# Patient Record
Sex: Female | Born: 1970 | Race: Black or African American | Hispanic: No | Marital: Single | Smoking: Current every day smoker
Health system: Southern US, Community
[De-identification: ages and names within clinical notes are randomized; demographics above are authoritative.]

## PROBLEM LIST (undated history)

## (undated) DIAGNOSIS — D649 Anemia, unspecified: Secondary | ICD-10-CM

---

## 1998-09-18 ENCOUNTER — Emergency Department (HOSPITAL_COMMUNITY): Admission: EM | Admit: 1998-09-18 | Discharge: 1998-09-18 | Payer: Self-pay | Admitting: Emergency Medicine

## 1998-09-25 ENCOUNTER — Emergency Department (HOSPITAL_COMMUNITY): Admission: EM | Admit: 1998-09-25 | Discharge: 1998-09-25 | Payer: Self-pay | Admitting: Emergency Medicine

## 1999-03-16 ENCOUNTER — Emergency Department (HOSPITAL_COMMUNITY): Admission: EM | Admit: 1999-03-16 | Discharge: 1999-03-16 | Payer: Self-pay | Admitting: Emergency Medicine

## 1999-04-09 ENCOUNTER — Encounter: Payer: Self-pay | Admitting: Emergency Medicine

## 1999-04-09 ENCOUNTER — Emergency Department (HOSPITAL_COMMUNITY): Admission: EM | Admit: 1999-04-09 | Discharge: 1999-04-09 | Payer: Self-pay | Admitting: Emergency Medicine

## 1999-07-04 ENCOUNTER — Inpatient Hospital Stay (HOSPITAL_COMMUNITY): Admission: EM | Admit: 1999-07-04 | Discharge: 1999-07-06 | Payer: Self-pay | Admitting: *Deleted

## 1999-07-04 ENCOUNTER — Encounter: Payer: Self-pay | Admitting: Emergency Medicine

## 1999-07-05 ENCOUNTER — Encounter: Payer: Self-pay | Admitting: Internal Medicine

## 1999-12-04 ENCOUNTER — Emergency Department (HOSPITAL_COMMUNITY): Admission: EM | Admit: 1999-12-04 | Discharge: 1999-12-04 | Payer: Self-pay | Admitting: Emergency Medicine

## 2002-06-03 ENCOUNTER — Other Ambulatory Visit: Admission: RE | Admit: 2002-06-03 | Discharge: 2002-06-03 | Payer: Self-pay | Admitting: Family Medicine

## 2003-08-25 ENCOUNTER — Emergency Department (HOSPITAL_COMMUNITY): Admission: EM | Admit: 2003-08-25 | Discharge: 2003-08-25 | Payer: Self-pay | Admitting: Emergency Medicine

## 2006-09-27 ENCOUNTER — Emergency Department (HOSPITAL_COMMUNITY): Admission: EM | Admit: 2006-09-27 | Discharge: 2006-09-27 | Payer: Self-pay | Admitting: Emergency Medicine

## 2010-05-25 ENCOUNTER — Observation Stay (HOSPITAL_COMMUNITY): Admission: EM | Admit: 2010-05-25 | Discharge: 2010-05-26 | Payer: Self-pay | Admitting: Family Medicine

## 2010-10-20 LAB — CROSSMATCH
ABO/RH(D): O POS
Antibody Screen: NEGATIVE
Unit division: 0
Unit division: 0
Unit division: 0

## 2010-10-20 LAB — ABO/RH: ABO/RH(D): O POS

## 2010-12-24 NOTE — Consult Note (Signed)
[place]. [facility]  Patient:    Candace Brown                          MRN: [id number] Proc. Date: 07/05/99 Adm. Date:  [id number] Attending:  Farley Ly                          Consultation Report  REASON FOR CONSULTATION:  Asked to see patient at the request of Farley Ly, M.D. for evaluation and opinion because of heavy periods and  anemia.  HISTORY OF PRESENT ILLNESS:  The patient is an inpatient at [facility], ______  [address], bed 2,  and was admitted on July 04, 1999.  The patient is a 40 year old gravida 4, para 4, with a history of menometrorrhagia and irregular  cycles.  She had been on oral contraceptives successfully until eight months ago when she voluntarily stopped them because she did not want to take them any more. Approximately one month following this, she began to have heavier more irregular periods, lasting anywhere from 7 to 14 days, passing clots and having cramps. he presented to the hospital with fatigue and was found to have a hemoglobin of 5.8. She was not bleeding at the time and had no lower abdominal pain.  PAST MEDICAL HISTORY:  Four vaginal deliveries.  No history of STDs. Contraception:  Condoms.  The patient denies pain.  Denies history of fibroids r family history of significant GYN problems.  REVIEW OF SYSTEMS:  Otherwise, review of systems is negative.  ALLERGIES:  No known drug allergies.  SOCIAL HISTORY:  She smokes one-quarter pack of cigarettes daily.  Social alcohol. No recreational drugs.  No history of current hormonal use.  No history of rectal bleeding.  LABORATORY DATA:  The patients laboratory work to this point shows a hemoglobin of 5.5, GC and Chlamydia cultures negative, an ultrasound showing a uterus with a -cm area consistent with a midbody fibroid, not impinging on the endometrial cavity, the endometrial cavity was 9.3 mm in width, which was normal, and her  last menstrual period was May 22, 1999.  PHYSICAL EXAMINATION:  GENERAL:  Patient is alert, oriented, cooperative and in no acute distress.  ABDOMEN:  Exam shows soft and tender abdomen.  No masses or organomegaly, hepatosplenomegaly, inguinal lymphadenopathy or CVA tenderness.  PELVIC:   Exam shows normal external genitals, normal vagina with no bleeding or discharge, no cervical motion tenderness.  Uterus is top-normal-size, shape and  consistency, mobile and nontender.  No adnexal masses or tenderness are noted.  RECTAL:  Deferred.  IMPRESSION:  Menometrorrhagia and anemia, which is symptomatic.  Patients urine  pregnancy test was negative yesterday.  She has a small fibroid uterus, not thought to be contributing to the patients current problem.  RECOMMENDATION:  I recommend the patient undergo a withdrawal bleed using Provera 10 mg daily for seven days, followed by the initiation of a low-dose combined oral contraceptive she was previously on for a three-month trial.  Iron therapy two o three times daily after blood transfusion, which is currently planned at this time, is appropriate and followup with the G[facility] Clinic at [facility] is also appropriate. DD:  07/05/99 TD:  07/06/99 Job: [id number]

## 2017-06-21 ENCOUNTER — Emergency Department (HOSPITAL_COMMUNITY): Payer: No Typology Code available for payment source

## 2017-06-21 ENCOUNTER — Inpatient Hospital Stay (HOSPITAL_COMMUNITY)
Admission: EM | Admit: 2017-06-21 | Discharge: 2017-06-30 | DRG: 125 | Payer: No Typology Code available for payment source | Attending: Internal Medicine | Admitting: Internal Medicine

## 2017-06-21 ENCOUNTER — Encounter (HOSPITAL_COMMUNITY): Payer: Self-pay | Admitting: *Deleted

## 2017-06-21 DIAGNOSIS — D509 Iron deficiency anemia, unspecified: Secondary | ICD-10-CM | POA: Diagnosis present

## 2017-06-21 DIAGNOSIS — R760 Raised antibody titer: Secondary | ICD-10-CM | POA: Diagnosis not present

## 2017-06-21 DIAGNOSIS — N920 Excessive and frequent menstruation with regular cycle: Secondary | ICD-10-CM | POA: Diagnosis present

## 2017-06-21 DIAGNOSIS — R911 Solitary pulmonary nodule: Secondary | ICD-10-CM | POA: Diagnosis present

## 2017-06-21 DIAGNOSIS — D72829 Elevated white blood cell count, unspecified: Secondary | ICD-10-CM | POA: Diagnosis not present

## 2017-06-21 DIAGNOSIS — T380X5A Adverse effect of glucocorticoids and synthetic analogues, initial encounter: Secondary | ICD-10-CM | POA: Diagnosis not present

## 2017-06-21 DIAGNOSIS — H5461 Unqualified visual loss, right eye, normal vision left eye: Secondary | ICD-10-CM | POA: Diagnosis not present

## 2017-06-21 DIAGNOSIS — R51 Headache: Secondary | ICD-10-CM | POA: Diagnosis present

## 2017-06-21 DIAGNOSIS — G939 Disorder of brain, unspecified: Secondary | ICD-10-CM | POA: Diagnosis present

## 2017-06-21 DIAGNOSIS — R9089 Other abnormal findings on diagnostic imaging of central nervous system: Secondary | ICD-10-CM

## 2017-06-21 DIAGNOSIS — D473 Essential (hemorrhagic) thrombocythemia: Secondary | ICD-10-CM

## 2017-06-21 DIAGNOSIS — D75839 Thrombocytosis, unspecified: Secondary | ICD-10-CM

## 2017-06-21 DIAGNOSIS — F17210 Nicotine dependence, cigarettes, uncomplicated: Secondary | ICD-10-CM | POA: Diagnosis present

## 2017-06-21 DIAGNOSIS — H539 Unspecified visual disturbance: Secondary | ICD-10-CM

## 2017-06-21 DIAGNOSIS — D649 Anemia, unspecified: Secondary | ICD-10-CM

## 2017-06-21 HISTORY — DX: Anemia, unspecified: D64.9

## 2017-06-21 MED ORDER — LORAZEPAM 2 MG/ML IJ SOLN
1.0000 mg | Freq: Once | INTRAMUSCULAR | Status: AC
  Administered 2017-06-21: 1 mg via INTRAVENOUS
  Filled 2017-06-21: qty 1

## 2017-06-21 MED ORDER — MAGNESIUM SULFATE 2 GM/50ML IV SOLN: 2.0000 g | Freq: Once | INTRAVENOUS | Status: DC

## 2017-06-21 MED ORDER — GADOBENATE DIMEGLUMINE 529 MG/ML IV SOLN
20.0000 mL | Freq: Once | INTRAVENOUS | Status: AC | PRN
  Administered 2017-06-21: 17 mL via INTRAVENOUS

## 2017-06-21 NOTE — ED Provider Notes (Signed)
Patient care signed out by Joline Maxcy, PA-C, at end of shift. She presents with no medical history having moderate headache pain for the past 2 weeks. She reports Exedrin makes the pain better. For the last one week, she has noticed blurred vision in her right eye. Yesterday she lost all vision in the same eye.   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 Neurohospitalist (Dr. Rory Percy) consulted who recommended MR brain and orbits w and w/o CM. He will see her in the ED on completion of those tests.   Dr. Rory Percy in the ED to examine the patient. MRI results are significantly abnormal but nonspecific. He reports he spoke to neuro-radiology who gives results and recommends LP, and further states LP is safe for the patient to undergo. Discussed with ED attending, Dr. Randal Buba, who states this would need to be done under fluoro guided procedure by IR. Per Dr. Rory Percy, procedure should be done no later than this morning. DDx: meningitis vs mass vs metastasis. He does not recommend medications (ie antibiotics or steroids) at this time. He will leave his recommendations for tests to be run on the CSF in his consult note. Patient can be admitted by the hospitalist at [facility].     Charlann Lange, PA-C 06/22/17 [phone]

## 2017-06-21 NOTE — ED Triage Notes (Signed)
Pt complains of migraine and loss of vision in her right eye. Pt noticed blurred vision in her right eye 1 week ago. Pt states she noticed she could not see with her right eye 2 days ago. Vision in her left eye is unchanged.

## 2017-06-21 NOTE — ED Provider Notes (Signed)
[facility] DEPT Provider Note   CSN: [id number] Arrival date & time: 06/21/17  1819     History   Chief Complaint Chief Complaint  Patient presents with  . Migraine  . Visual Field Change    HPI Candace Brown is a 46 y.o. female who presents to the emergency department with a chief complaint of intermittent headaches over the last 2 weeks.  She reports associated blurred vision in her right eye that began 1 week ago that progressively worsened until she gradually lost vision in the right eye 2 days ago.  She reports a history of chronic headaches that are relieved with taking Excedrin.  She reports the current headache is located over her right forehead and behind her right eye.  She denies fever, chills, periorbital pain or swelling, nausea, vomiting, eye redness or pain.  No recent trauma or injury to the right eye.  No dysarthria, syncope, or dizziness, or numbness.  No left eye complaints or change in vision.  The patient is otherwise healthy.  No chronic medical conditions.  No daily medications.  She is a current everyday smoker.  She is not established with neurology for her headaches.   The history is provided by the patient. No language interpreter was used.    History reviewed. No pertinent past medical history.  There are no active problems to display for this patient.   History reviewed. No pertinent surgical history.  OB History    No data available       Home Medications    Prior to Admission medications   Medication Sig Start Date End Date Taking? Authorizing Provider  ferrous sulfate 325 (65 FE) MG EC tablet Take 325 mg daily with breakfast by mouth.   Yes [provider]  FIBER PO Take 1 capsule daily by mouth.   Yes [provider]  Multiple Vitamins-Minerals (MULTIVITAMIN ADULT) TABS Take 1 tablet daily by mouth.   Yes [provider]    Family History No family history on file.  Social  History Social History   Tobacco Use  . Smoking status: Current Every Day Smoker  . Smokeless tobacco: Never Used  Substance Use Topics  . Alcohol use: No    Frequency: Never  . Drug use: No     Allergies   Patient has no known allergies.   Review of Systems Review of Systems  Constitutional: Negative for activity change, chills and fever.  HENT: Negative for congestion.   Eyes: Positive for visual disturbance. Negative for pain, discharge, redness and itching.  Respiratory: Negative for cough and shortness of breath.   Cardiovascular: Negative for chest pain.  Gastrointestinal: Negative for abdominal pain, diarrhea, nausea and vomiting.  Genitourinary: Negative for dysuria.  Musculoskeletal: Negative for back pain, gait problem, joint swelling, neck pain and neck stiffness.  Skin: Negative for rash.  Allergic/Immunologic: Negative for immunocompromised state.  Neurological: Positive for headaches. Negative for syncope, speech difficulty, weakness, light-headedness and numbness.  Psychiatric/Behavioral: Negative for confusion.     Physical Exam Updated Vital Signs BP 132/90   Pulse 68   Temp 98.5 F (36.9 C) (Oral)   Resp 18   LMP 06/19/2017   SpO2 100%   Physical Exam  Constitutional: No distress.  HENT:  Head: Normocephalic.  Eyes: Conjunctivae are normal. Right eye exhibits no chemosis, no discharge, no exudate and no hordeolum. Left eye exhibits no chemosis, no discharge, no exudate and no hordeolum. Right conjunctiva is not injected. Right conjunctiva  has no hemorrhage. Left conjunctiva is not injected. Left conjunctiva has no hemorrhage. No scleral icterus. Right eye exhibits abnormal extraocular motion. Left eye exhibits normal extraocular motion. Left pupil is round and reactive.  Drooping of the right eyelid.  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  No nystagmus.  Normal extraocular movements of the left eye.  No chemosis, discharge.   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   Neck: Neck supple.  Cardiovascular: Normal rate, regular rhythm and normal heart sounds. Exam reveals no gallop and no friction rub.  No murmur heard. Pulmonary/Chest: Effort normal and breath sounds normal. No stridor. No respiratory distress. She has no wheezes. She has no rales.  Abdominal: Soft. She exhibits no distension.  Neurological: She is alert.  Skin: Skin is warm. Capillary refill takes less than 2 seconds. No rash noted.  Psychiatric: Her behavior is normal.  Nursing note and vitals reviewed.  ED Treatments / Results  Labs (all labs ordered are listed, but only abnormal results are displayed) Labs Reviewed  CBC WITH DIFFERENTIAL/PLATELET - Abnormal; Notable for the following components:      Result Value   Hemoglobin 6.0 (*)    HCT 22.6 (*)    MCV 58.2 (*)    MCH 15.5 (*)    MCHC 26.5 (*)    RDW 25.7 (*)    Platelets 1,193 (*)    All other components within normal limits  I-STAT CHEM 8, ED - Abnormal; Notable for the following components:   Calcium, Ion 1.12 (*)    Hemoglobin 7.5 (*)    HCT 22.0 (*)    All other components within normal limits  RPR  PATHOLOGIST SMEAR REVIEW  PROTIME-INR  VITAMIN B12  FOLATE  IRON AND TIBC  FERRITIN  RETICULOCYTES  TYPE AND SCREEN    EKG  EKG Interpretation None       Radiology No results found.  Procedures Procedures (including critical care time)  Medications Ordered in ED Medications  LORazepam (ATIVAN) injection 1 mg (1 mg Intravenous Given 06/21/17 2217)  gadobenate dimeglumine (MULTIHANCE) injection 20 mL (17 mLs Intravenous Contrast Given 06/21/17 2309)     Initial Impression / Assessment and Plan / ED Course  I have reviewed the triage vital signs and the nursing notes.  Pertinent labs & imaging results that were available during my care of the patient were reviewed by me and considered in my medical decision making (see chart for  details).  Clinical Course as of Jun 23 51  Wed Jun 21, 2017  2159 [facility] spoke with Dr. Rory Percy who recommends MRI of the orbits and brain with and without contrast.  Spoke with Marjory Lies, MRI technologist, we discussed that he is using a backup coil for the machine.  He is calling radiology to see if radiology has any concerns regarding using the coil for the MRI orbits.  [MM]  2206 MRI technologist will perform MRI of orbits and brain with and without contrast at [facility].  Ativan ordered for anxiety.  [MM]    Clinical Course User Index [MM] Samari Gorby A, PA-C   46 year old female presenting with intermittent headaches times 2 weeks with associated blurry vision that has worsened until she lost vision in the right eye 2 days ago.  Vision loss was painless.  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  No chronic past medical history.  The patient's only daily medications include an iron supplement and a daily vitamin.  No recent trauma or injury.  RPR pending.  The patient was seen and evaluated with Dr. Kathrynn Humble, attending physician.   - Consulted Dr. Rory Percy with neurology who recommended MRI with and without contrast of the brain and orbits.   - Reconsulted Dr. Rory Percy who will come to see the patient in the ED. CBC and iStat Chem 8 pending.  - Patient care transferred to PA Upstill at the end of my shift. Patient presentation, ED course, and plan of care discussed with review of all pertinent labs and imaging. Please see his/her note for further details regarding further ED course and disposition.   Final Clinical Impressions(s) / ED Diagnoses   Final diagnoses:  None    ED Discharge Orders    None       Joye Wesenberg A, PA-C 06/22/17 [place], Ankit, MD 06/22/17 1650

## 2017-06-22 ENCOUNTER — Encounter (HOSPITAL_COMMUNITY): Payer: Self-pay | Admitting: Internal Medicine

## 2017-06-22 ENCOUNTER — Other Ambulatory Visit: Payer: Self-pay

## 2017-06-22 ENCOUNTER — Inpatient Hospital Stay (HOSPITAL_COMMUNITY): Payer: No Typology Code available for payment source

## 2017-06-22 DIAGNOSIS — T380X5A Adverse effect of glucocorticoids and synthetic analogues, initial encounter: Secondary | ICD-10-CM | POA: Diagnosis not present

## 2017-06-22 DIAGNOSIS — D509 Iron deficiency anemia, unspecified: Secondary | ICD-10-CM | POA: Diagnosis present

## 2017-06-22 DIAGNOSIS — R51 Headache: Secondary | ICD-10-CM | POA: Diagnosis present

## 2017-06-22 DIAGNOSIS — R911 Solitary pulmonary nodule: Secondary | ICD-10-CM | POA: Diagnosis present

## 2017-06-22 DIAGNOSIS — H539 Unspecified visual disturbance: Secondary | ICD-10-CM | POA: Diagnosis not present

## 2017-06-22 DIAGNOSIS — D473 Essential (hemorrhagic) thrombocythemia: Secondary | ICD-10-CM

## 2017-06-22 DIAGNOSIS — D72829 Elevated white blood cell count, unspecified: Secondary | ICD-10-CM | POA: Diagnosis not present

## 2017-06-22 DIAGNOSIS — G939 Disorder of brain, unspecified: Secondary | ICD-10-CM | POA: Diagnosis present

## 2017-06-22 DIAGNOSIS — H5461 Unqualified visual loss, right eye, normal vision left eye: Secondary | ICD-10-CM | POA: Diagnosis present

## 2017-06-22 DIAGNOSIS — F17210 Nicotine dependence, cigarettes, uncomplicated: Secondary | ICD-10-CM | POA: Diagnosis present

## 2017-06-22 DIAGNOSIS — R9089 Other abnormal findings on diagnostic imaging of central nervous system: Secondary | ICD-10-CM | POA: Diagnosis not present

## 2017-06-22 DIAGNOSIS — D649 Anemia, unspecified: Secondary | ICD-10-CM

## 2017-06-22 DIAGNOSIS — R760 Raised antibody titer: Secondary | ICD-10-CM | POA: Diagnosis not present

## 2017-06-22 DIAGNOSIS — D75839 Thrombocytosis, unspecified: Secondary | ICD-10-CM

## 2017-06-22 DIAGNOSIS — N920 Excessive and frequent menstruation with regular cycle: Secondary | ICD-10-CM | POA: Diagnosis present

## 2017-06-22 LAB — I-STAT CHEM 8, ED
BUN: 13 mg/dL (ref 6–20)
CHLORIDE: 109 mmol/L (ref 101–111)
CREATININE: 0.6 mg/dL (ref 0.44–1.00)
Calcium, Ion: 1.12 mmol/L — ABNORMAL LOW (ref 1.15–1.40)
GLUCOSE: 94 mg/dL (ref 65–99)
HEMATOCRIT: 22 % — AB (ref 36.0–46.0)
Hemoglobin: 7.5 g/dL — ABNORMAL LOW (ref 12.0–15.0)
POTASSIUM: 4 mmol/L (ref 3.5–5.1)
Sodium: 142 mmol/L (ref 135–145)
TCO2: 26 mmol/L (ref 22–32)

## 2017-06-22 LAB — CBC WITH DIFFERENTIAL/PLATELET
BASOS ABS: 0 10*3/uL (ref 0.0–0.1)
BASOS PCT: 0 %
Basophils Absolute: 0 10*3/uL (ref 0.0–0.1)
Basophils Relative: 0 %
EOS PCT: 1 %
EOS PCT: 1 %
Eosinophils Absolute: 0.1 10*3/uL (ref 0.0–0.7)
Eosinophils Absolute: 0.1 10*3/uL (ref 0.0–0.7)
HCT: 23.2 % — ABNORMAL LOW (ref 36.0–46.0)
HEMATOCRIT: 22.6 % — AB (ref 36.0–46.0)
Hemoglobin: 6 g/dL — CL (ref 12.0–15.0)
Hemoglobin: 6.1 g/dL — CL (ref 12.0–15.0)
LYMPHS PCT: 37 %
Lymphocytes Relative: 30 %
Lymphs Abs: 2.6 10*3/uL (ref 0.7–4.0)
Lymphs Abs: 1.9 10*3/uL (ref 0.7–4.0)
MCH: 15.5 pg — ABNORMAL LOW (ref 26.0–34.0)
MCH: 15.4 pg — ABNORMAL LOW (ref 26.0–34.0)
MCHC: 26.5 g/dL — ABNORMAL LOW (ref 30.0–36.0)
MCHC: 26.3 g/dL — ABNORMAL LOW (ref 30.0–36.0)
MCV: 58.2 fL — AB (ref 78.0–100.0)
MCV: 58.7 fL — ABNORMAL LOW (ref 78.0–100.0)
MONO ABS: 0.3 10*3/uL (ref 0.1–1.0)
Monocytes Absolute: 0.4 10*3/uL (ref 0.1–1.0)
Monocytes Relative: 5 %
Monocytes Relative: 5 %
NEUTROS PCT: 57 %
Neutro Abs: 4 10*3/uL (ref 1.7–7.7)
Neutro Abs: 3.9 10*3/uL (ref 1.7–7.7)
Neutrophils Relative %: 64 %
PLATELETS: 1193 10*3/uL — AB (ref 150–400)
PLATELETS: 1212 10*3/uL — AB (ref 150–400)
RBC: 3.88 MIL/uL (ref 3.87–5.11)
RBC: 3.95 MIL/uL (ref 3.87–5.11)
RDW: 25.7 % — ABNORMAL HIGH (ref 11.5–15.5)
RDW: 25.3 % — AB (ref 11.5–15.5)
WBC: 7.1 10*3/uL (ref 4.0–10.5)
WBC: 6.2 10*3/uL (ref 4.0–10.5)

## 2017-06-22 LAB — CSF CELL COUNT WITH DIFFERENTIAL
RBC COUNT CSF: 0 /mm3
Tube #: 4
WBC CSF: 8 /mm3 — AB (ref 0–5)

## 2017-06-22 LAB — TSH: TSH: 1.377 u[IU]/mL (ref 0.350–4.500)

## 2017-06-22 LAB — HIV ANTIBODY (ROUTINE TESTING W REFLEX): HIV SCREEN 4TH GENERATION: NONREACTIVE

## 2017-06-22 LAB — IRON AND TIBC
Iron: 5 ug/dL — ABNORMAL LOW (ref 28–170)
Saturation Ratios: 2 % — ABNORMAL LOW (ref 10.4–31.8)
TIBC: 325 ug/dL (ref 250–450)
UIBC: 320 ug/dL

## 2017-06-22 LAB — SEDIMENTATION RATE: SED RATE: 64 mm/h — AB (ref 0–22)

## 2017-06-22 LAB — FERRITIN: FERRITIN: 4 ng/mL — AB (ref 11–307)

## 2017-06-22 LAB — COMPREHENSIVE METABOLIC PANEL
ALBUMIN: 3.2 g/dL — AB (ref 3.5–5.0)
ALT: 14 U/L (ref 14–54)
AST: 33 U/L (ref 15–41)
Alkaline Phosphatase: 64 U/L (ref 38–126)
Anion gap: 6 (ref 5–15)
BUN: 10 mg/dL (ref 6–20)
CHLORIDE: 107 mmol/L (ref 101–111)
CO2: 25 mmol/L (ref 22–32)
CREATININE: 0.71 mg/dL (ref 0.44–1.00)
Calcium: 8.9 mg/dL (ref 8.9–10.3)
GFR calc Af Amer: >60 mL/min (ref >60)
GFR calc non Af Amer: >60 mL/min (ref >60)
Glucose, Bld: 91 mg/dL (ref 65–99)
POTASSIUM: 3.5 mmol/L (ref 3.5–5.1)
Sodium: 138 mmol/L (ref 135–145)
Total Bilirubin: 0.5 mg/dL (ref 0.3–1.2)
Total Protein: 8 g/dL (ref 6.5–8.1)

## 2017-06-22 LAB — PREPARE RBC (CROSSMATCH)

## 2017-06-22 LAB — PROTEIN, CSF: Total  Protein, CSF: 68 mg/dL — ABNORMAL HIGH (ref 15–45)

## 2017-06-22 LAB — PROTIME-INR
INR: 1.07
PROTHROMBIN TIME: 13.8 s (ref 11.4–15.2)

## 2017-06-22 LAB — PATHOLOGIST SMEAR REVIEW

## 2017-06-22 LAB — RETICULOCYTES
RBC.: 3.89 MIL/uL (ref 3.87–5.11)
RETIC COUNT ABSOLUTE: 38.9 10*3/uL (ref 19.0–186.0)
Retic Ct Pct: 1 % (ref 0.4–3.1)

## 2017-06-22 LAB — HEMOGLOBIN AND HEMATOCRIT, BLOOD
HEMATOCRIT: 25.6 % — AB (ref 36.0–46.0)
Hemoglobin: 7.3 g/dL — ABNORMAL LOW (ref 12.0–15.0)

## 2017-06-22 LAB — GLUCOSE, CSF: Glucose, CSF: 54 mg/dL (ref 40–70)

## 2017-06-22 LAB — C-REACTIVE PROTEIN: CRP: 1.9 mg/dL — AB (ref <1.0)

## 2017-06-22 LAB — MAGNESIUM: Magnesium: 1.9 mg/dL (ref 1.7–2.4)

## 2017-06-22 LAB — RPR: RPR: NONREACTIVE

## 2017-06-22 LAB — VITAMIN B12: Vitamin B-12: 412 pg/mL (ref 180–914)

## 2017-06-22 LAB — FOLATE: Folate: 18.9 ng/mL (ref >5.9)

## 2017-06-22 MED ORDER — SODIUM CHLORIDE 0.9 % IV SOLN
510.0000 mg | Freq: Once | INTRAVENOUS | Status: AC
  Administered 2017-06-22: 510 mg via INTRAVENOUS
  Filled 2017-06-22: qty 17

## 2017-06-22 MED ORDER — ACETAMINOPHEN 650 MG RE SUPP: 650.0000 mg | Freq: Four times a day (QID) | RECTAL | Status: DC | PRN

## 2017-06-22 MED ORDER — ONDANSETRON HCL 4 MG PO TABS: 4.0000 mg | ORAL_TABLET | Freq: Four times a day (QID) | ORAL | Status: DC | PRN

## 2017-06-22 MED ORDER — LIDOCAINE HCL 1 % IJ SOLN
INTRAMUSCULAR | Status: AC
  Administered 2017-06-22: 2 mL
  Filled 2017-06-22: qty 20

## 2017-06-22 MED ORDER — ENSURE ENLIVE PO LIQD: 237.0000 mL | Freq: Every day | ORAL | Status: DC | PRN

## 2017-06-22 MED ORDER — ACETAMINOPHEN 325 MG PO TABS
650.0000 mg | ORAL_TABLET | Freq: Four times a day (QID) | ORAL | Status: DC | PRN
  Administered 2017-06-22 – 2017-06-23 (×4): 650 mg via ORAL
  Filled 2017-06-22 (×5): qty 2

## 2017-06-22 MED ORDER — SODIUM CHLORIDE 0.9 % IV SOLN: Freq: Once | INTRAVENOUS | Status: DC

## 2017-06-22 MED ORDER — ENSURE ENLIVE PO LIQD: 237.0000 mL | Freq: Two times a day (BID) | ORAL | Status: DC

## 2017-06-22 MED ORDER — ONDANSETRON HCL 4 MG/2ML IJ SOLN: 4.0000 mg | Freq: Four times a day (QID) | INTRAMUSCULAR | Status: DC | PRN

## 2017-06-22 NOTE — Consult Note (Signed)
[place]  [phone] [phone]     ID: Alba Cory DOB: May 07, 1971  MR#: [id number]  VPX#:[id number]  Patient Care Team: Patient, No Pcp Per as PCP - General (General Practice) Chauncey Cruel, MD OTHER MD:  CHIEF COMPLAINT: Right vision change and pain; anemia, thrombocytosis  CURRENT TREATMENT: feraheme   HISTORY OF CURRENT ILLNESS: The patient tells me she has been having discomfort in the right periorbital area with some visual changes for at least a week but it became more intense 06/20/2017, and this brought her to the emergency room.  She thought she was having migraine headaches but this certainly was different as it involved right visual loss.  The patient had a brain MRI and was evaluate there is an infiltrative soft tissue density involving the right orbital apex and right cavernous sinus with associated dural thickening and extension along the right tentorium.  The differential is broad.  Lumbar puncture is being planned as well as multiple laboratory tests for infectious and inflammatory conditions  In addition patient was noted to have a hemoglobin of 6.0, with an MCV of 58.2.  The white cell count was 7.1 with an unremarkable differential.  The platelet count was 1,893,000.  We were consulted for further evaluation and treatment of these problems  INTERVAL HISTORY: I met with the patient in her room 06/22/2017.  Also present were the patient's sister Abigail Butts and the patient's son   REVIEW OF SYSTEMS: The patient has been able to continue to work through it last week.  She does have a history of migraines and initially she thought the right eye discomfort was migraine related.  She denies any significant weight loss or significant fatigue.  She denies drenching sweats.  She has not had any rash, itching, or adenopathy that she is aware of.  There have been no falls or other trauma.  There has been no unusual travel destination.  A  detailed review of systems today was otherwise s noncontributory  PAST MEDICAL HISTORY: Past Medical History:  Diagnosis Date  . Anemia   She has required transfusion in the past because of iron deficiency anemia  PAST SURGICAL HISTORY: History reviewed. No pertinent surgical history.  FAMILY HISTORY Family History  Problem Relation Age of Onset  . Diabetes Mellitus II Neg Hx   . Cancer Neg Hx     GYNECOLOGIC HISTORY:  Patient's last menstrual period was 06/19/2017. The patient had menarche at age 67 and first live birth at age 89.  She is GX before.  She has periods monthly, lasting 3-4 days, of which 3 days are heavy.  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  At home she is with her husband Pincus Sanes.   ADVANCED DIRECTIVES:    HEALTH MAINTENANCE: Social History   Tobacco Use  . Smoking status: Current Every Day Smoker  . Smokeless tobacco: Never Used  Substance Use Topics  . Alcohol use: No    Frequency: Never  . Drug use: No     No Known Allergies  Current Facility-Administered Medications  Medication Dose Route Frequency Provider Last Rate Last Dose  . 0.9 %  sodium chloride infusion   Intravenous Once Rise Patience, MD      . acetaminophen (TYLENOL) tablet 650 mg  650 mg Oral Q6H PRN Rise Patience, MD   650 mg at 06/22/17 0505   Or  . acetaminophen (TYLENOL) suppository 650 mg  650 mg  Rectal Q6H PRN Rise Patience, MD      . feeding supplement (ENSURE ENLIVE) (ENSURE ENLIVE) liquid 237 mL  237 mL Oral BID BM Rise Patience, MD      . ondansetron [facility]) tablet 4 mg  4 mg Oral Q6H PRN Rise Patience, MD       Or  . ondansetron [facility]) injection 4 mg  4 mg Intravenous Q6H PRN Rise Patience, MD        OBJECTIVE: Middle-aged African-American woman examined in bed  Vitals:   06/22/17 0300 06/22/17 0336  BP: 131/67 (!) 142/75  Pulse: 70 77  Resp:  18  Temp:  99.8 F (37.7 C)    SpO2: 100% 100%     Body mass index is 33.87 kg/m.   Wt Readings from Last 3 Encounters:  06/22/17 197 lb 5 oz (89.5 kg)    Ocular: Reports blurred vision with her right eye, normal vision with the left eye Lymphatic: No cervical or supraclavicular adenopathy; no axillary or inguinal adenopathy Lungs no rales or rhonchi, auscultated anterolaterally Heart regular rate and rhythm Abd soft, nontender, positive bowel sounds MSK no focal spinal tenderness, no joint edema Neuro: motor non-focal, well-oriented, appropriate affect Breasts: Deferred   LAB RESULTS:  CMP     Component Value Date/Time   NA 138 06/22/2017 0458   K 3.5 06/22/2017 0458   CL 107 06/22/2017 0458   CO2 25 06/22/2017 0458   GLUCOSE 91 06/22/2017 0458   BUN 10 06/22/2017 0458   CREATININE 0.71 06/22/2017 0458   CALCIUM 8.9 06/22/2017 0458   PROT 8.0 06/22/2017 0458   ALBUMIN 3.2 (L) 06/22/2017 0458   AST 33 06/22/2017 0458   ALT 14 06/22/2017 0458   ALKPHOS 64 06/22/2017 0458   BILITOT 0.5 06/22/2017 0458   GFRNONAA >60 06/22/2017 0458   GFRAA >60 06/22/2017 0458    No results found for: TOTALPROTELP, ALBUMINELP, A1GS, A2GS, BETS, BETA2SER, GAMS, MSPIKE, SPEI  No results found for: KPAFRELGTCHN, LAMBDASER, KAPLAMBRATIO  Lab Results  Component Value Date   WBC 6.2 06/22/2017   NEUTROABS 3.9 06/22/2017   HGB 6.1 (LL) 06/22/2017   HCT 23.2 (L) 06/22/2017   MCV 58.7 (L) 06/22/2017   PLT 1,212 ([place]) 06/22/2017    '@LASTCHEMISTRY'$ @  No results found for: LABCA2  No components found for: VZDGLO756  Recent Labs  Lab 06/22/17 0105  INR 1.07    No results found for: LABCA2  No results found for: EPP295  No results found for: JOA416  No results found for: SAY301  No results found for: CA2729  No components found for: HGQUANT  No results found for: CEA1 / No results found for: CEA1   No results found for: AFPTUMOR  No results found for: CHROMOGRNA  No results found for:  PSA1  Admission on 06/21/2017  Component Date Value Ref Range Status  . WBC 06/21/2017 7.1  4.0 - 10.5 K/uL Final  . RBC 06/21/2017 3.88  3.87 - 5.11 MIL/uL Final  . Hemoglobin 06/21/2017 6.0* 12.0 - 15.0 g/dL Final   Comment: REPEATED TO VERIFY CRITICAL RESULT CALLED TO, READ BACK BY AND VERIFIED WITH: MARQUEZ,A RN 11.15.18 '@0041'$  ZANDO,C   . HCT 06/21/2017 22.6* 36.0 - 46.0 % Final  . MCV 06/21/2017 58.2* 78.0 - 100.0 fL Final  . MCH 06/21/2017 15.5* 26.0 - 34.0 pg Final  . MCHC 06/21/2017 26.5* 30.0 - 36.0 g/dL Final  . RDW 06/21/2017 25.7* 11.5 - 15.5 % Final  .  Platelets 06/21/2017 1,193* 150 - 400 K/uL Final   Comment: REPEATED TO VERIFY CRITICAL RESULT CALLED TO, READ BACK BY AND VERIFIED WITH: MARQUEZ,A RN 11.15.18 '@0041'$  ZANDO,C   . Neutrophils Relative % 06/21/2017 57  % Final  . Lymphocytes Relative 06/21/2017 37  % Final  . Monocytes Relative 06/21/2017 5  % Final  . Eosinophils Relative 06/21/2017 1  % Final  . Basophils Relative 06/21/2017 0  % Final  . Neutro Abs 06/21/2017 4.0  1.7 - 7.7 K/uL Final  . Lymphs Abs 06/21/2017 2.6  0.7 - 4.0 K/uL Final  . Monocytes Absolute 06/21/2017 0.4  0.1 - 1.0 K/uL Final  . Eosinophils Absolute 06/21/2017 0.1  0.0 - 0.7 K/uL Final  . Basophils Absolute 06/21/2017 0.0  0.0 - 0.1 K/uL Final  . RBC Morphology 06/21/2017 Spherocytes present   Final  . Smear Review 06/21/2017 PLATELET COUNT CONFIRMED BY SMEAR   Final  . Sodium 06/22/2017 142  135 - 145 mmol/L Final  . Potassium 06/22/2017 4.0  3.5 - 5.1 mmol/L Final  . Chloride 06/22/2017 109  101 - 111 mmol/L Final  . BUN 06/22/2017 13  6 - 20 mg/dL Final  . Creatinine, Ser 06/22/2017 0.60  0.44 - 1.00 mg/dL Final  . Glucose, Bld 06/22/2017 94  65 - 99 mg/dL Final  . Calcium, Ion 06/22/2017 1.12* 1.15 - 1.40 mmol/L Final  . TCO2 06/22/2017 26  22 - 32 mmol/L Final  . Hemoglobin 06/22/2017 7.5* 12.0 - 15.0 g/dL Final  . HCT 06/22/2017 22.0* 36.0 - 46.0 % Final  . ABO/RH(D)  06/22/2017 O POS   Final  . Antibody Screen 06/22/2017 NEG   Final  . Sample Expiration 06/22/2017 06/25/2017   Final  . Unit Number 06/22/2017 [id number]   Final  . Blood Component Type 06/22/2017 RED CELLS,LR   Final  . Unit division 06/22/2017 00   Final  . Status of Unit 06/22/2017 ALLOCATED   Final  . Transfusion Status 06/22/2017 OK TO TRANSFUSE   Final  . Crossmatch Result 06/22/2017 Compatible   Final  . Prothrombin Time 06/22/2017 13.8  11.4 - 15.2 seconds Final  . INR 06/22/2017 1.07   Final  . Retic Ct Pct 06/22/2017 1.0  0.4 - 3.1 % Final  . RBC. 06/22/2017 3.89  3.87 - 5.11 MIL/uL Final  . Retic Count, Absolute 06/22/2017 38.9  19.0 - 186.0 K/uL Final  . WBC 06/22/2017 6.2  4.0 - 10.5 K/uL Final  . RBC 06/22/2017 3.95  3.87 - 5.11 MIL/uL Final  . Hemoglobin 06/22/2017 6.1* 12.0 - 15.0 g/dL Final   CRITICAL VALUE NOTED.  VALUE IS CONSISTENT WITH PREVIOUSLY REPORTED AND CALLED VALUE.  Marland Kitchen HCT 06/22/2017 23.2* 36.0 - 46.0 % Final  . MCV 06/22/2017 58.7* 78.0 - 100.0 fL Final  . MCH 06/22/2017 15.4* 26.0 - 34.0 pg Final  . MCHC 06/22/2017 26.3* 30.0 - 36.0 g/dL Final  . RDW 06/22/2017 25.3* 11.5 - 15.5 % Final  . Platelets 06/22/2017 1,212* 150 - 400 K/uL Final   CRITICAL VALUE NOTED.  VALUE IS CONSISTENT WITH PREVIOUSLY REPORTED AND CALLED VALUE.  Marland Kitchen Neutrophils Relative % 06/22/2017 64  % Final  . Lymphocytes Relative 06/22/2017 30  % Final  . Monocytes Relative 06/22/2017 5  % Final  . Eosinophils Relative 06/22/2017 1  % Final  . Basophils Relative 06/22/2017 0  % Final  . Neutro Abs 06/22/2017 3.9  1.7 - 7.7 K/uL Final  . Lymphs Abs 06/22/2017 1.9  0.7 - 4.0 K/uL Final  . Monocytes Absolute 06/22/2017 0.3  0.1 - 1.0 K/uL Final  . Eosinophils Absolute 06/22/2017 0.1  0.0 - 0.7 K/uL Final  . Basophils Absolute 06/22/2017 0.0  0.0 - 0.1 K/uL Final  . RBC Morphology 06/22/2017 TARGET CELLS   Final   POLYCHROMASIA PRESENT  . Sodium 06/22/2017 138  135 - 145 mmol/L Final   . Potassium 06/22/2017 3.5  3.5 - 5.1 mmol/L Final  . Chloride 06/22/2017 107  101 - 111 mmol/L Final  . CO2 06/22/2017 25  22 - 32 mmol/L Final  . Glucose, Bld 06/22/2017 91  65 - 99 mg/dL Final  . BUN 06/22/2017 10  6 - 20 mg/dL Final  . Creatinine, Ser 06/22/2017 0.71  0.44 - 1.00 mg/dL Final  . Calcium 06/22/2017 8.9  8.9 - 10.3 mg/dL Final  . Total Protein 06/22/2017 8.0  6.5 - 8.1 g/dL Final  . Albumin 06/22/2017 3.2* 3.5 - 5.0 g/dL Final  . AST 06/22/2017 33  15 - 41 U/L Final  . ALT 06/22/2017 14  14 - 54 U/L Final  . Alkaline Phosphatase 06/22/2017 64  38 - 126 U/L Final  . Total Bilirubin 06/22/2017 0.5  0.3 - 1.2 mg/dL Final  . GFR calc non Af Amer 06/22/2017 >60  >60 mL/min Final  . GFR calc Af Amer 06/22/2017 >60  >60 mL/min Final   Comment: (NOTE) The eGFR has been calculated using the CKD EPI equation. This calculation has not been validated in all clinical situations. eGFR's persistently <60 mL/min signify possible Chronic Kidney Disease.   . Anion gap 06/22/2017 6  5 - 15 Final  . Magnesium 06/22/2017 1.9  1.7 - 2.4 mg/dL Final  . TSH 06/22/2017 1.377  0.350 - 4.500 uIU/mL Final   Performed by a 3rd Generation assay with a functional sensitivity of <=0.01 uIU/mL.  . Order Confirmation 06/22/2017 ORDER PROCESSED BY BLOOD BANK   Final  . Blood Product Unit Number 06/22/2017 [id number]   Final  . Unit Type and Rh 06/22/2017 5100   Final  . Blood Product Expiration Date 06/22/2017 [id number]   Final    (this displays the last labs from the last 3 days)  No results found for: TOTALPROTELP, ALBUMINELP, A1GS, A2GS, BETS, BETA2SER, GAMS, MSPIKE, SPEI (this displays SPEP labs)  No results found for: KPAFRELGTCHN, LAMBDASER, KAPLAMBRATIO (kappa/lambda light chains)  No results found for: HGBA, HGBA2QUANT, HGBFQUANT, HGBSQUAN (Hemoglobinopathy evaluation)   No results found for: LDH  No results found for: IRON, TIBC, IRONPCTSAT (Iron and TIBC)  No results  found for: FERRITIN  Urinalysis No results found for: COLORURINE, APPEARANCEUR, LABSPEC, PHURINE, GLUCOSEU, HGBUR, BILIRUBINUR, KETONESUR, PROTEINUR, UROBILINOGEN, NITRITE, LEUKOCYTESUR   STUDIES: Dg Chest 2 View  Result Date: 06/22/2017 CLINICAL DATA:  46 year old female with headache and blurred vision. EXAM: CHEST  2 VIEW COMPARISON:  None. FINDINGS: The lungs are clear. There is no pleural effusion or pneumothorax. Top-normal cardiac size. No acute osseous pathology. IMPRESSION: No active cardiopulmonary disease. Electronically Signed   By: Anner Crete M.D.   On: 06/22/2017 03:25   Mr Jeri Cos And Wo Contrast  Result Date: 06/22/2017 CLINICAL DATA:  Initial evaluation for acute migraine headache with right-sided visual loss. EXAM: MRI HEAD AND ORBITS WITHOUT AND WITH CONTRAST TECHNIQUE: Multiplanar, multiecho pulse sequences of the brain and surrounding structures were obtained without and with intravenous contrast. Multiplanar, multiecho pulse sequences of the orbits and surrounding structures were obtained including fat saturation techniques, before and  after intravenous contrast administration. CONTRAST:  30m MULTIHANCE GADOBENATE DIMEGLUMINE 529 MG/ML IV SOLN COMPARISON:  None available. FINDINGS: MRI HEAD FINDINGS Brain: Cerebral volume within normal limits for age. There is abnormal enhancement with soft tissue thickening involving the right cavernous sinus (series 18, image 8). Associated dural thickening and enhancement extends laterally around the right temporal pole in the right middle cranial fossa, and superiorly about the right frontal lobe (series 16, image 20). Extension along the right tentorium posteriorly. Process encases and narrows the cavernous right ICA. Possible involvement of the pituitary gland which is somewhat lobulated in appearance, with prominent enhancement along the pituitary stalk. Abnormal enhancing soft tissue extends into the right orbital apex, better  evaluated on corresponding orbital MRI. Additionally, enhancing soft tissue extends inferiorly into the right sphenoid sinus (Series 15, image 13). T2/FLAIR signal abnormality within the right temporal pole compatible with associated vasogenic edema. No associated diffusion abnormality or susceptibility artifact. Appearance of the brain is otherwise within normal limits. No other focal parenchymal signal abnormality. No significant cerebral white matter disease for age. No evidence for acute or subacute infarct. No encephalomalacia to suggest chronic infarction. No other mass lesion or abnormal enhancement. No hydrocephalus. No extra-axial fluid collection. Major dural sinuses grossly patent. Vascular: Cavernous right ICA narrowed and attenuated but retains a patent flow void. Remainder of the intracranial vascular flow voids are widely patent and well preserved. 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 Craniocervical junction otherwise unremarkable. Bone marrow signal intensity somewhat diffusely decreased on T1 weighted sequence, most commonly related to anemia, smoking, or obesity. No scalp soft tissue abnormality. Other: Mastoid air cells are clear. Inner ear structures grossly normal. MRI ORBITS FINDINGS Orbits: Globes symmetric in size with normal morphology bilaterally. Abnormal somewhat ill-defined avidly enhancing soft tissue density extends into the right orbital apex, most prominent inferiorly at the origins of the right medial and inferior rectus muscles (series 17, image 17). Again, this extends posteriorly into the right cavernous sinus. Enhancing soft tissue partially encases the right optic nerves at the posterior right orbit, extending posteriorly to the optic chiasm. Superior orbital veins remain within normal limits. Lacrimal glands relatively normal. Is spared and relatively normal in appearance. Visualized sinuses:  Abnormal enhancement extends from the right orbital apex towards the right sphenoid sinus. Scattered mucosal thickening within the ethmoid air cells. Visualized sinuses otherwise clear. Soft tissues: Periorbital soft tissues within normal limits. IMPRESSION: 1. Abnormal infiltrative soft tissue density and enhancement involving the right orbital apex, extending into the right cavernous sinus, with associated dural thickening and enhancement about the right frontal and temporal lobes, with extension along the right tentorium. Associated edema within the anterior right temporal lobe. Findings are nonspecific, with primary differential considerations including possible meningitis, which may be either infectious or inflammatory in nature, as sarcoidosis or TB meningitis could have this appearance. Idiopathic orbital inflammation (pseudotumor - Tolosa Hunt syndrome) could also be considered. Lymphoma or metastatic disease not entirely excluded. While meningiomas can also involve this region, this is felt to be less likely given the infiltrative nature of this process. Correlation with CSF studies may be helpful for further delineation. 2. 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 3. Otherwise normal brain MRI. Findings were discussed by telephone at the time of interpretation on 06/22/2017 at 12:55 am with Dr. ARory Percy Electronically Signed   By: BJeannine BogaM.D.   On: 06/22/2017 00:58   Mr  Orbits W Wo Contrast  Result Date: 06/22/2017 CLINICAL DATA:  Initial evaluation for acute migraine headache with right-sided visual loss. EXAM: MRI HEAD AND ORBITS WITHOUT AND WITH CONTRAST TECHNIQUE: Multiplanar, multiecho pulse sequences of the brain and surrounding structures were obtained without and with intravenous contrast. Multiplanar, multiecho pulse sequences of the orbits and surrounding structures were obtained including fat saturation techniques, before and after  intravenous contrast administration. CONTRAST:  69m MULTIHANCE GADOBENATE DIMEGLUMINE 529 MG/ML IV SOLN COMPARISON:  None available. FINDINGS: MRI HEAD FINDINGS Brain: Cerebral volume within normal limits for age. There is abnormal enhancement with soft tissue thickening involving the right cavernous sinus (series 18, image 8). Associated dural thickening and enhancement extends laterally around the right temporal pole in the right middle cranial fossa, and superiorly about the right frontal lobe (series 16, image 20). Extension along the right tentorium posteriorly. Process encases and narrows the cavernous right ICA. Possible involvement of the pituitary gland which is somewhat lobulated in appearance, with prominent enhancement along the pituitary stalk. Abnormal enhancing soft tissue extends into the right orbital apex, better evaluated on corresponding orbital MRI. Additionally, enhancing soft tissue extends inferiorly into the right sphenoid sinus (Series 15, image 13). T2/FLAIR signal abnormality within the right temporal pole compatible with associated vasogenic edema. No associated diffusion abnormality or susceptibility artifact. Appearance of the brain is otherwise within normal limits. No other focal parenchymal signal abnormality. No significant cerebral white matter disease for age. No evidence for acute or subacute infarct. No encephalomalacia to suggest chronic infarction. No other mass lesion or abnormal enhancement. No hydrocephalus. No extra-axial fluid collection. Major dural sinuses grossly patent. Vascular: Cavernous right ICA narrowed and attenuated but retains a patent flow void. Remainder of the intracranial vascular flow voids are widely patent and well preserved. 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 Craniocervical junction otherwise unremarkable. Bone marrow signal intensity somewhat diffusely decreased on T1  weighted sequence, most commonly related to anemia, smoking, or obesity. No scalp soft tissue abnormality. Other: Mastoid air cells are clear. Inner ear structures grossly normal. MRI ORBITS FINDINGS Orbits: Globes symmetric in size with normal morphology bilaterally. Abnormal somewhat ill-defined avidly enhancing soft tissue density extends into the right orbital apex, most prominent inferiorly at the origins of the right medial and inferior rectus muscles (series 17, image 17). Again, this extends posteriorly into the right cavernous sinus. Enhancing soft tissue partially encases the right optic nerves at the posterior right orbit, extending posteriorly to the optic chiasm. Superior orbital veins remain within normal limits. Lacrimal glands relatively normal. Is spared and relatively normal in appearance. Visualized sinuses: Abnormal enhancement extends from the right orbital apex towards the right sphenoid sinus. Scattered mucosal thickening within the ethmoid air cells. Visualized sinuses otherwise clear. Soft tissues: Periorbital soft tissues within normal limits. IMPRESSION: 1. Abnormal infiltrative soft tissue density and enhancement involving the right orbital apex, extending into the right cavernous sinus, with associated dural thickening and enhancement about the right frontal and temporal lobes, with extension along the right tentorium. Associated edema within the anterior right temporal lobe. Findings are nonspecific, with primary differential considerations including possible meningitis, which may be either infectious or inflammatory in nature, as sarcoidosis or TB meningitis could have this appearance. Idiopathic orbital inflammation (pseudotumor - Tolosa Hunt syndrome) could also be considered. Lymphoma or metastatic disease not entirely excluded. While meningiomas can also involve this region, this is felt to be less likely given the infiltrative  nature of this process. Correlation with CSF studies  may be helpful for further delineation. 2. 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 3. Otherwise normal brain MRI. Findings were discussed by telephone at the time of interpretation on 06/22/2017 at 12:55 am with Dr. Rory Percy. Electronically Signed   By: Jeannine Boga M.D.   On: 06/22/2017 00:58    ELIGIBLE FOR AVAILABLE RESEARCH PROTOCOL: no  ASSESSMENT: 46 y.o. [place] woman presenting with microcytic hypoproliferative anemia and thrombocytosis, as well as Right visual changes and Right orbital pain associated with a CNS infiltrative process by CNS MRI  REVIEW OF BLOOD FILM: no schistocytes, rare tailed poikylocytes, moderate rouleaux, slight left shift, some hypersegmented polys, thrombocytosis  PLAN:  (1) iron deficiency anemia: patient has been on oral iron supplementation but this has not kept up with her menstrual losses; discussed feraheme with patient; she is agreeable to infusion, she will need a 2d dose next week and long-term follow up for this until she stops menstruating  (a) additional anemia workup labs are pending  (2) thrombocytosis: this is reactive, 2d to #1, and should resolve gradually with iron supplementation  (3) Right periorbital infiltrate: patient greports no "B" symptoms by history and has no palpable adenopathy by exam; doubt NHL; will follow with you pending results of planned workup  Appreciate consult.  Chauncey Cruel, MD   06/22/2017 8:19 AM Medical Oncology and Hematology [facility] [address] [place], [place] [address] Tel. [phone]    Fax. [phone]

## 2017-06-22 NOTE — ED Notes (Signed)
Please call report to [facility] [phone] at [facility]

## 2017-06-22 NOTE — Care Management Note (Signed)
Case Management Note  Patient Details  Name: Candace Brown MRN: [id number] Date of Birth: 11/18/1970  Subjective/Objective: 46 y/o f admitted w/R eye vision loss. Hx: anemia,thrombocytopenia. From home. Hematology/neuro following. LP today.                   Action/Plan:d/c plan home.   Expected Discharge Date:                  Expected Discharge Plan:  Home/Self Care  In-House Referral:     Discharge planning Services  CM Consult  Post Acute Care Choice:    Choice offered to:     DME Arranged:    DME Agency:     HH Arranged:    HH Agency:     Status of Service:  In process, will continue to follow  If discussed at Long Length of Stay Meetings, dates discussed:    Additional Comments:  Dessa Phi, RN 06/22/2017, 10:32 AM

## 2017-06-22 NOTE — Progress Notes (Signed)
Subjective: Subjectively no significant change.  Exam: Vitals:   06/22/17 0300 06/22/17 0336  BP: 131/67 (!) 142/75  Pulse: 70 77  Resp:  18  Temp:  99.8 F (37.7 C)  SpO2: 100% 100%    HEENT-  Normocephalic, no lesions, without obvious abnormality.  Normal external eye and conjunctiva.  Normal TM's bilaterally.  Normal auditory canals and external ears. Normal external nose, mucus membranes and septum.  Normal pharynx. Cardiovascular- S1, S2 normal, pulses palpable throughout   Lungs- chest clear, no wheezing, rales, normal symmetric air entry    Neuro:  CN:  Patient has no vision in her right eye --cannot see shapes or light.  Full vision in left eye.  Pupils -left pupil is 2 mm briskly reactive, right pupil is approximately 3 mm sluggishly reactive and the flight is held on it shows hippus.  EOM--left eye has full extraocular movements while the right eye has full capabilities of abducting and abducting however it is limited as she cannot Burry her eye in each corner of the nasal versus the lateral aspect.  Without nystagmus. Facial sensation is intact to light touch. Face is symmetric at rest with normal strength and mobility. Hearing is intact to conversational voice. Palate elevates symmetrically and uvula is midline. Voice is normal in tone, pitch and quality. Bilateral SCM and trapezii are 5/5. Tongue is midline with normal bulk and mobility.  Motor: Normal bulk, tone, and strength. 5/5 throughout. No drift.  Sensation: Intact to light touch.  DTRs: 2+, symmetric  Toes downgoing bilaterally. No pathologic reflexes.  Coordination: Finger-to-nose and heel-to-shin are without dysmetria   Medications:  Scheduled: . feeding supplement (ENSURE ENLIVE)  237 mL Oral BID BM   Continuous: . sodium chloride      Pertinent Labs/Diagnostics: Platelet count is 1212 Hematocrit 23.2 Hemoglobin is 6.1 MCV is 58.7  LP is pending  Dg Chest 2 View  Result Date: 06/22/2017 CLINICAL  DATA:  46 year old female with headache and blurred vision. EXAM: CHEST  2 VIEW COMPARISON:  None. FINDINGS: The lungs are clear. There is no pleural effusion or pneumothorax. Top-normal cardiac size. No acute osseous pathology. IMPRESSION: No active cardiopulmonary disease. Electronically Signed   By: Anner Crete M.D.   On: 06/22/2017 03:25   Mr Jeri Cos And Wo Contrast  Result Date: 06/22/2017 CLINICAL DATA:  Initial evaluation for acute migraine headache with right-sided visual loss. EXAM: MRI HEAD AND ORBITS WITHOUT AND WITH CONTRAST TECHNIQUE: Multiplanar, multiecho pulse sequences of the brain and surrounding structures were obtained without and with intravenous contrast. Multiplanar, multiecho pulse sequences of the orbits and surrounding structures were obtained including fat saturation techniques, before and after intravenous contrast administration. CONTRAST:  53mL MULTIHANCE GADOBENATE DIMEGLUMINE 529 MG/ML IV SOLN COMPARISON:  None available. FINDINGS: MRI HEAD FINDINGS Brain: Cerebral volume within normal limits for age. There is abnormal enhancement with soft tissue thickening involving the right cavernous sinus (series 18, image 8). Associated dural thickening and enhancement extends laterally around the right temporal pole in the right middle cranial fossa, and superiorly about the right frontal lobe (series 16, image 20). Extension along the right tentorium posteriorly. Process encases and narrows the cavernous right ICA. Possible involvement of the pituitary gland which is somewhat lobulated in appearance, with prominent enhancement along the pituitary stalk. Abnormal enhancing soft tissue extends into the right orbital apex, better evaluated on corresponding orbital MRI. Additionally, enhancing soft tissue extends inferiorly into the right sphenoid sinus (Series 15, image 13). T2/FLAIR signal abnormality  within the right temporal pole compatible with associated vasogenic edema. No  associated diffusion abnormality or susceptibility artifact. Appearance of the brain is otherwise within normal limits. No other focal parenchymal signal abnormality. No significant cerebral white matter disease for age. No evidence for acute or subacute infarct. No encephalomalacia to suggest chronic infarction. No other mass lesion or abnormal enhancement. No hydrocephalus. No extra-axial fluid collection. Major dural sinuses grossly patent. Vascular: Cavernous right ICA narrowed and attenuated but retains a patent flow void. Remainder of the intracranial vascular flow voids are widely patent and well preserved. 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 Craniocervical junction otherwise unremarkable. Bone marrow signal intensity somewhat diffusely decreased on T1 weighted sequence, most commonly related to anemia, smoking, or obesity. No scalp soft tissue abnormality. Other: Mastoid air cells are clear. Inner ear structures grossly normal. MRI ORBITS FINDINGS Orbits: Globes symmetric in size with normal morphology bilaterally. Abnormal somewhat ill-defined avidly enhancing soft tissue density extends into the right orbital apex, most prominent inferiorly at the origins of the right medial and inferior rectus muscles (series 17, image 17). Again, this extends posteriorly into the right cavernous sinus. Enhancing soft tissue partially encases the right optic nerves at the posterior right orbit, extending posteriorly to the optic chiasm. Superior orbital veins remain within normal limits. Lacrimal glands relatively normal. Is spared and relatively normal in appearance. Visualized sinuses: Abnormal enhancement extends from the right orbital apex towards the right sphenoid sinus. Scattered mucosal thickening within the ethmoid air cells. Visualized sinuses otherwise clear. Soft tissues: Periorbital soft tissues within normal limits. IMPRESSION:  1. Abnormal infiltrative soft tissue density and enhancement involving the right orbital apex, extending into the right cavernous sinus, with associated dural thickening and enhancement about the right frontal and temporal lobes, with extension along the right tentorium. Associated edema within the anterior right temporal lobe. Findings are nonspecific, with primary differential considerations including possible meningitis, which may be either infectious or inflammatory in nature, as sarcoidosis or TB meningitis could have this appearance. Idiopathic orbital inflammation (pseudotumor - Tolosa Hunt syndrome) could also be considered. Lymphoma or metastatic disease not entirely excluded. While meningiomas can also involve this region, this is felt to be less likely given the infiltrative nature of this process. Correlation with CSF studies may be helpful for further delineation. 2. 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 3. Otherwise normal brain MRI. Findings were discussed by telephone at the time of interpretation on 06/22/2017 at 12:55 am with Dr. Rory Percy. Electronically Signed   By: Jeannine Boga M.D.   On: 06/22/2017 00:58   Mr Rosealee Albee EV Contrast  Result Date: 06/22/2017 CLINICAL DATA:  Initial evaluation for acute migraine headache with right-sided visual loss. EXAM: MRI HEAD AND ORBITS WITHOUT AND WITH CONTRAST TECHNIQUE: Multiplanar, multiecho pulse sequences of the brain and surrounding structures were obtained without and with intravenous contrast. Multiplanar, multiecho pulse sequences of the orbits and surrounding structures were obtained including fat saturation techniques, before and after intravenous contrast administration. CONTRAST:  81mL MULTIHANCE GADOBENATE DIMEGLUMINE 529 MG/ML IV SOLN COMPARISON:  None available. FINDINGS: MRI HEAD FINDINGS Brain: Cerebral volume within normal limits for age. There is abnormal enhancement with soft tissue  thickening involving the right cavernous sinus (series 18, image 8). Associated dural thickening and enhancement extends laterally around the right temporal pole in the right middle cranial fossa, and superiorly about the right frontal lobe (series 16, image 20).  Extension along the right tentorium posteriorly. Process encases and narrows the cavernous right ICA. Possible involvement of the pituitary gland which is somewhat lobulated in appearance, with prominent enhancement along the pituitary stalk. Abnormal enhancing soft tissue extends into the right orbital apex, better evaluated on corresponding orbital MRI. Additionally, enhancing soft tissue extends inferiorly into the right sphenoid sinus (Series 15, image 13). T2/FLAIR signal abnormality within the right temporal pole compatible with associated vasogenic edema. No associated diffusion abnormality or susceptibility artifact. Appearance of the brain is otherwise within normal limits. No other focal parenchymal signal abnormality. No significant cerebral white matter disease for age. No evidence for acute or subacute infarct. No encephalomalacia to suggest chronic infarction. No other mass lesion or abnormal enhancement. No hydrocephalus. No extra-axial fluid collection. Major dural sinuses grossly patent. Vascular: Cavernous right ICA narrowed and attenuated but retains a patent flow void. Remainder of the intracranial vascular flow voids are widely patent and well preserved. 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 Craniocervical junction otherwise unremarkable. Bone marrow signal intensity somewhat diffusely decreased on T1 weighted sequence, most commonly related to anemia, smoking, or obesity. No scalp soft tissue abnormality. Other: Mastoid air cells are clear. Inner ear structures grossly normal. MRI ORBITS FINDINGS Orbits: Globes symmetric in size with normal morphology  bilaterally. Abnormal somewhat ill-defined avidly enhancing soft tissue density extends into the right orbital apex, most prominent inferiorly at the origins of the right medial and inferior rectus muscles (series 17, image 17). Again, this extends posteriorly into the right cavernous sinus. Enhancing soft tissue partially encases the right optic nerves at the posterior right orbit, extending posteriorly to the optic chiasm. Superior orbital veins remain within normal limits. Lacrimal glands relatively normal. Is spared and relatively normal in appearance. Visualized sinuses: Abnormal enhancement extends from the right orbital apex towards the right sphenoid sinus. Scattered mucosal thickening within the ethmoid air cells. Visualized sinuses otherwise clear. Soft tissues: Periorbital soft tissues within normal limits. IMPRESSION: 1. Abnormal infiltrative soft tissue density and enhancement involving the right orbital apex, extending into the right cavernous sinus, with associated dural thickening and enhancement about the right frontal and temporal lobes, with extension along the right tentorium. Associated edema within the anterior right temporal lobe. Findings are nonspecific, with primary differential considerations including possible meningitis, which may be either infectious or inflammatory in nature, as sarcoidosis or TB meningitis could have this appearance. Idiopathic orbital inflammation (pseudotumor - Tolosa Hunt syndrome) could also be considered. Lymphoma or metastatic disease not entirely excluded. While meningiomas can also involve this region, this is felt to be less likely given the infiltrative nature of this process. Correlation with CSF studies may be helpful for further delineation. 2. 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 3. Otherwise normal brain MRI. Findings were discussed by telephone at the time of interpretation on 06/22/2017 at 12:55 am with  Dr. Rory Percy. Electronically Signed   By: Jeannine Boga M.D.   On: 06/22/2017 00:58     Etta Quill PA-C [facility] [phone]  Impression: --46/F with subacute loss of vision in right eye and headache.PMH significant for anemia. Her MRI findings are concerning for an infiltrative density with some enhancement that involves the right orbital apex right cavernous sinus and extends into the right frontal and temporal lobes with extension into the right tentorium.  Inflammatory, infiltrative, infectious lesions are all possible.  We are waiting LP.    Recommendations:  LP (either in ER or fluoro guided first thing in the AM) Will recommend doing the regular tests on CSF (glucose, protein, cell count, bacterial cultures and fungal cultures) and additionally ACE, Lyme, HSV, TB PCR, cytology and flow cytometry. CXR Serum ACE Serum Lyme  Further recs based on results.   Roland Rack, MD [facility] [phone]  If 7pm- 7am, please page neurology on call as listed in [place].    06/22/2017, 9:07 AM

## 2017-06-22 NOTE — Progress Notes (Signed)
PROGRESS NOTE  Subjective: Candace Brown is a 46 y.o. female with a history of tobacco use and microcytic anemia who presented to the ED 11/14 due to progressive loss of vision in the right eye. She reports 1 week of gradual blurring of vision in the right eye only with worsening over 2 days prior to arrival associated with right retro-orbital headache and vision totally blacked out. In the ED, exam shows total loss of vision in right eye with diminished pupillary response. MRI orbits and brain demonstrated an enhancing infiltrative soft tissue density involving the right orbital apex and cavernous sinus, causing dural thickening and extending along the right tentorium. Neurology was consulted and recommended LP which is planned. Hemoglobin was noted to be low at 6 with platelets elevated at 1.2 million for which hematology was consulted. She was admitted earlier this morning by Dr. Hal Hope.   At the time of my evaluation this morning she further denied recent weight loss, fever, chills, night sweats, unusual bleeding/bruising, or fatigue. Recently completed menstrual cycle which was regular, 3-4 days every month. No travel history, no trauma history. Vision is unchanged from admission.   Objective: BP 125/71   Pulse 64   Temp 98.4 F (36.9 C) (Oral)   Resp 16   Ht 5\' 4"  (1.626 m)   Wt 89.5 kg (197 lb 5 oz)   LMP 06/19/2017   SpO2 100%   BMI 33.87 kg/m   Gen: WDWN 46yo F in no distress Pulm: Clear and nonlabored on room air  CV: RRR, no murmur, no JVD, no edema GI: Soft, NT, ND, +BS  Neuro: Alert and oriented. Right eye with no vision whatsoever, vision preserved in left. estricted vertical ROM but able to adduct past midline and abduct, no nystagmus. Pupil very modestly reactive with intact, right ptosis noted. No other focal deficits in cranial or peripheral nerves.  Skin: No rashes, lesions no ulcers  Assessment & Plan: Right eye vision loss, painful with infiltrative process on MRI:  Broad DDx including inflammatory, infectious, and malignant. Heme/onc not suspicious of NHL/BCL.  - Appreciate neurology recommendations. Awaiting LP with blood and fluid labs ordered.  Iron-deficiency anemia: Suspect from menorrhagia. Anemia panel shows severe iron deficiency. No schistocytes on smear.  - Appreciate hematology recommendations, will get IV iron and transfusion today. Recheck CBC in AM. No active bleeding.   Thrombocytosis: Suspected to be reactive to the anemia.  - Will continue to monitor per hematology recommendations  Vance Gather, MD [facility] Pager [phone] 06/22/2017, 2:22 PM

## 2017-06-22 NOTE — H&P (Signed)
History and Physical    Shiann Kam KGY:[id number] DOB: 03-05-1971 DOA: 06/21/2017  PCP: Patient, No Pcp Per  Patient coming from: Home.  Chief Complaint: Right eye vision loss.  HPI: Loriene Taunton is a 46 y.o. female with history of anemia probably secondary to menorrhagia presents to the ER with complaints of vision loss in the right eye.  Patient states over the last week patient started developing right-sided retro-orbital pain with progressive loss of vision and over the last 2 days has completely lost vision and unable to see anything.  Patient states he does get headaches off and on and takes Excedrin but this headache was different from the usual.  Denies any weakness of the upper or lower extremities.  Denies any fever chills.  Denies any nausea vomiting.  ED Course: In the ER on exam patient has complete loss of vision on the right eye.  MRI of the brain and MRI of the orbit has been done.  Neurologist on-call has been consulted.  Patient has been admitted for further observation and management.  Review of Systems: As per HPI, rest all negative.   Past Medical History:  Diagnosis Date  . Anemia     History reviewed. No pertinent surgical history.   reports that she has been smoking.  she has never used smokeless tobacco. She reports that she does not drink alcohol or use drugs.  No Known Allergies  Family History  Problem Relation Age of Onset  . Diabetes Mellitus II Neg Hx   . Cancer Neg Hx     Prior to Admission medications   Medication Sig Start Date End Date Taking? Authorizing Provider  ferrous sulfate 325 (65 FE) MG EC tablet Take 325 mg daily with breakfast by mouth.   Yes [provider]  FIBER PO Take 1 capsule daily by mouth.   Yes [provider]  Multiple Vitamins-Minerals (MULTIVITAMIN ADULT) TABS Take 1 tablet daily by mouth.   Yes [provider]    Physical Exam: Vitals:   06/22/17 0200 06/22/17 0230 06/22/17 0300  06/22/17 0336  BP: (!) 130/91 136/87 131/67 (!) 142/75  Pulse: 67 66 70 77  Resp:    18  Temp:    99.8 F (37.7 C)  TempSrc:    Oral  SpO2: 99% 99% 100% 100%  Weight:    89.5 kg (197 lb 5 oz)  Height:    5\' 4"  (1.626 m)      Constitutional: Moderately built and nourished. Vitals:   06/22/17 0200 06/22/17 0230 06/22/17 0300 06/22/17 0336  BP: (!) 130/91 136/87 131/67 (!) 142/75  Pulse: 67 66 70 77  Resp:    18  Temp:    99.8 F (37.7 C)  TempSrc:    Oral  SpO2: 99% 99% 100% 100%  Weight:    89.5 kg (197 lb 5 oz)  Height:    5\' 4"  (1.626 m)   Eyes: Anicteric mild pallor.  Unable to see anything on the right eye. ENMT: No discharge from the ears eyes nose or mouth. Neck: No mass felt.  No JVD appreciated. Respiratory: No rhonchi or crepitations. Cardiovascular: S1-S2 heard no murmurs appreciated. Abdomen: Soft nontender bowel sounds present. Musculoskeletal: No edema.  No joint effusion. Skin: No rash.  Skin appears warm. Neurologic: Alert awake oriented to time place and person.  Moves all extremities.  Poor vision in the right eye.  Pupils are reacting to light on the left eye. Psychiatric: Appears normal.  Normal  affect.   Labs on Admission: I have personally reviewed following labs and imaging studies  CBC: Recent Labs  Lab 06/21/17 2359 06/22/17 0016  WBC 7.1  --   NEUTROABS 4.0  --   HGB 6.0* 7.5*  HCT 22.6* 22.0*  MCV 58.2*  --   PLT 1,193*  --    Basic Metabolic Panel: Recent Labs  Lab 06/22/17 0016  NA 142  K 4.0  CL 109  GLUCOSE 94  BUN 13  CREATININE 0.60   GFR: Estimated Creatinine Clearance: 94.1 mL/min (by C-G formula based on SCr of 0.6 mg/dL). Liver Function Tests: No results for input(s): AST, ALT, ALKPHOS, BILITOT, PROT, ALBUMIN in the last 168 hours. No results for input(s): LIPASE, AMYLASE in the last 168 hours. No results for input(s): AMMONIA in the last 168 hours. Coagulation Profile: Recent Labs  Lab 06/22/17 0105  INR  1.07   Cardiac Enzymes: No results for input(s): CKTOTAL, CKMB, CKMBINDEX, TROPONINI in the last 168 hours. BNP (last 3 results) No results for input(s): PROBNP in the last 8760 hours. HbA1C: No results for input(s): HGBA1C in the last 72 hours. CBG: No results for input(s): GLUCAP in the last 168 hours. Lipid Profile: No results for input(s): CHOL, HDL, LDLCALC, TRIG, CHOLHDL, LDLDIRECT in the last 72 hours. Thyroid Function Tests: No results for input(s): TSH, T4TOTAL, FREET4, T3FREE, THYROIDAB in the last 72 hours. Anemia Panel: Recent Labs    06/22/17 0105  RETICCTPCT 1.0   Urine analysis: No results found for: COLORURINE, APPEARANCEUR, LABSPEC, PHURINE, GLUCOSEU, HGBUR, BILIRUBINUR, KETONESUR, PROTEINUR, UROBILINOGEN, NITRITE, LEUKOCYTESUR Sepsis Labs: @LABRCNTIP (procalcitonin:4,lacticidven:4) )No results found for this or any previous visit (from the past 240 hour(s)).   Radiological Exams on Admission: Dg Chest 2 View  Result Date: 06/22/2017 CLINICAL DATA:  46 year old female with headache and blurred vision. EXAM: CHEST  2 VIEW COMPARISON:  None. FINDINGS: The lungs are clear. There is no pleural effusion or pneumothorax. Top-normal cardiac size. No acute osseous pathology. IMPRESSION: No active cardiopulmonary disease. Electronically Signed   By: Anner Crete M.D.   On: 06/22/2017 03:25   Mr Jeri Cos And Wo Contrast  Result Date: 06/22/2017 CLINICAL DATA:  Initial evaluation for acute migraine headache with right-sided visual loss. EXAM: MRI HEAD AND ORBITS WITHOUT AND WITH CONTRAST TECHNIQUE: Multiplanar, multiecho pulse sequences of the brain and surrounding structures were obtained without and with intravenous contrast. Multiplanar, multiecho pulse sequences of the orbits and surrounding structures were obtained including fat saturation techniques, before and after intravenous contrast administration. CONTRAST:  30mL MULTIHANCE GADOBENATE DIMEGLUMINE 529 MG/ML IV  SOLN COMPARISON:  None available. FINDINGS: MRI HEAD FINDINGS Brain: Cerebral volume within normal limits for age. There is abnormal enhancement with soft tissue thickening involving the right cavernous sinus (series 18, image 8). Associated dural thickening and enhancement extends laterally around the right temporal pole in the right middle cranial fossa, and superiorly about the right frontal lobe (series 16, image 20). Extension along the right tentorium posteriorly. Process encases and narrows the cavernous right ICA. Possible involvement of the pituitary gland which is somewhat lobulated in appearance, with prominent enhancement along the pituitary stalk. Abnormal enhancing soft tissue extends into the right orbital apex, better evaluated on corresponding orbital MRI. Additionally, enhancing soft tissue extends inferiorly into the right sphenoid sinus (Series 15, image 13). T2/FLAIR signal abnormality within the right temporal pole compatible with associated vasogenic edema. No associated diffusion abnormality or susceptibility artifact. Appearance of the brain is otherwise within normal limits.  No other focal parenchymal signal abnormality. No significant cerebral white matter disease for age. No evidence for acute or subacute infarct. No encephalomalacia to suggest chronic infarction. No other mass lesion or abnormal enhancement. No hydrocephalus. No extra-axial fluid collection. Major dural sinuses grossly patent. Vascular: Cavernous right ICA narrowed and attenuated but retains a patent flow void. Remainder of the intracranial vascular flow voids are widely patent and well preserved. 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 Craniocervical junction otherwise unremarkable. Bone marrow signal intensity somewhat diffusely decreased on T1 weighted sequence, most commonly related to anemia, smoking, or obesity. No scalp soft tissue  abnormality. Other: Mastoid air cells are clear. Inner ear structures grossly normal. MRI ORBITS FINDINGS Orbits: Globes symmetric in size with normal morphology bilaterally. Abnormal somewhat ill-defined avidly enhancing soft tissue density extends into the right orbital apex, most prominent inferiorly at the origins of the right medial and inferior rectus muscles (series 17, image 17). Again, this extends posteriorly into the right cavernous sinus. Enhancing soft tissue partially encases the right optic nerves at the posterior right orbit, extending posteriorly to the optic chiasm. Superior orbital veins remain within normal limits. Lacrimal glands relatively normal. Is spared and relatively normal in appearance. Visualized sinuses: Abnormal enhancement extends from the right orbital apex towards the right sphenoid sinus. Scattered mucosal thickening within the ethmoid air cells. Visualized sinuses otherwise clear. Soft tissues: Periorbital soft tissues within normal limits. IMPRESSION: 1. Abnormal infiltrative soft tissue density and enhancement involving the right orbital apex, extending into the right cavernous sinus, with associated dural thickening and enhancement about the right frontal and temporal lobes, with extension along the right tentorium. Associated edema within the anterior right temporal lobe. Findings are nonspecific, with primary differential considerations including possible meningitis, which may be either infectious or inflammatory in nature, as sarcoidosis or TB meningitis could have this appearance. Idiopathic orbital inflammation (pseudotumor - Tolosa Hunt syndrome) could also be considered. Lymphoma or metastatic disease not entirely excluded. While meningiomas can also involve this region, this is felt to be less likely given the infiltrative nature of this process. Correlation with CSF studies may be helpful for further delineation. 2. 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 3. Otherwise normal brain MRI. Findings were discussed by telephone at the time of interpretation on 06/22/2017 at 12:55 am with Dr. Rory Percy. Electronically Signed   By: Jeannine Boga M.D.   On: 06/22/2017 00:58   Mr Rosealee Albee AJ Contrast  Result Date: 06/22/2017 CLINICAL DATA:  Initial evaluation for acute migraine headache with right-sided visual loss. EXAM: MRI HEAD AND ORBITS WITHOUT AND WITH CONTRAST TECHNIQUE: Multiplanar, multiecho pulse sequences of the brain and surrounding structures were obtained without and with intravenous contrast. Multiplanar, multiecho pulse sequences of the orbits and surrounding structures were obtained including fat saturation techniques, before and after intravenous contrast administration. CONTRAST:  84mL MULTIHANCE GADOBENATE DIMEGLUMINE 529 MG/ML IV SOLN COMPARISON:  None available. FINDINGS: MRI HEAD FINDINGS Brain: Cerebral volume within normal limits for age. There is abnormal enhancement with soft tissue thickening involving the right cavernous sinus (series 18, image 8). Associated dural thickening and enhancement extends laterally around the right temporal pole in the right middle cranial fossa, and superiorly about the right frontal lobe (series 16, image 20). Extension along the right tentorium posteriorly. Process encases and narrows the cavernous right ICA. Possible involvement of the pituitary gland which is somewhat lobulated in appearance, with  prominent enhancement along the pituitary stalk. Abnormal enhancing soft tissue extends into the right orbital apex, better evaluated on corresponding orbital MRI. Additionally, enhancing soft tissue extends inferiorly into the right sphenoid sinus (Series 15, image 13). T2/FLAIR signal abnormality within the right temporal pole compatible with associated vasogenic edema. No associated diffusion abnormality or susceptibility artifact. Appearance of the brain is otherwise within  normal limits. No other focal parenchymal signal abnormality. No significant cerebral white matter disease for age. No evidence for acute or subacute infarct. No encephalomalacia to suggest chronic infarction. No other mass lesion or abnormal enhancement. No hydrocephalus. No extra-axial fluid collection. Major dural sinuses grossly patent. Vascular: Cavernous right ICA narrowed and attenuated but retains a patent flow void. Remainder of the intracranial vascular flow voids are widely patent and well preserved. 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 Craniocervical junction otherwise unremarkable. Bone marrow signal intensity somewhat diffusely decreased on T1 weighted sequence, most commonly related to anemia, smoking, or obesity. No scalp soft tissue abnormality. Other: Mastoid air cells are clear. Inner ear structures grossly normal. MRI ORBITS FINDINGS Orbits: Globes symmetric in size with normal morphology bilaterally. Abnormal somewhat ill-defined avidly enhancing soft tissue density extends into the right orbital apex, most prominent inferiorly at the origins of the right medial and inferior rectus muscles (series 17, image 17). Again, this extends posteriorly into the right cavernous sinus. Enhancing soft tissue partially encases the right optic nerves at the posterior right orbit, extending posteriorly to the optic chiasm. Superior orbital veins remain within normal limits. Lacrimal glands relatively normal. Is spared and relatively normal in appearance. Visualized sinuses: Abnormal enhancement extends from the right orbital apex towards the right sphenoid sinus. Scattered mucosal thickening within the ethmoid air cells. Visualized sinuses otherwise clear. Soft tissues: Periorbital soft tissues within normal limits. IMPRESSION: 1. Abnormal infiltrative soft tissue density and enhancement involving the right orbital apex, extending  into the right cavernous sinus, with associated dural thickening and enhancement about the right frontal and temporal lobes, with extension along the right tentorium. Associated edema within the anterior right temporal lobe. Findings are nonspecific, with primary differential considerations including possible meningitis, which may be either infectious or inflammatory in nature, as sarcoidosis or TB meningitis could have this appearance. Idiopathic orbital inflammation (pseudotumor - Tolosa Hunt syndrome) could also be considered. Lymphoma or metastatic disease not entirely excluded. While meningiomas can also involve this region, this is felt to be less likely given the infiltrative nature of this process. Correlation with CSF studies may be helpful for further delineation. 2. 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 3. Otherwise normal brain MRI. Findings were discussed by telephone at the time of interpretation on 06/22/2017 at 12:55 am with Dr. Rory Percy. Electronically Signed   By: Jeannine Boga M.D.   On: 06/22/2017 00:58     Assessment/Plan Principal Problem:   Vision loss of right eye Active Problems:   Thrombocytosis (HCC)   Anemia    1. Right eye vision loss with headache-appreciate neurology consult.  Differentials include infectious/inflammatory/malignancy.  Neurologist has recommended a lumbar puncture for which orders have been placed already.  No specific medications has been started.  Further recommendation will be based on the tests ordered including lumbar puncture, Lyme titers ACE titers.  Will follow further recommendations per neurologist. 2. Microcytic hypochromic anemia -likely from menorrhagia.  Check anemia panel.  I did discuss with on-call hematologist Dr. Alen Blew who at  this time advised there is no contraindication for transfusion due to thrombocytosis. 3. Thrombocytosis -discussed with hematologist feels that it may be  reactionary to the acute process going on.  Reconsult hematologist in the morning for further recommendations.   DVT prophylaxis: SCDs. Code Status: Full code. Family Communication: Discussed with patient. Disposition Plan: Home. Consults called: Neurology. Admission status: Inpatient.   Rise Patience MD [facility] Pager [phone].  If 7PM-7AM, please contact night-coverage [email or web address] Password [facility]  06/22/2017, 4:23 AM

## 2017-06-22 NOTE — Progress Notes (Signed)
Nutrition Brief Note  Patient identified on the Malnutrition Screening Tool (MST) Report  Wt Readings from Last 15 Encounters:  06/22/17 197 lb 5 oz (89.5 kg)    Body mass index is 33.87 kg/m. Patient meets criteria for obesity based on current BMI. No previous weight hx available in the chart. Skin WDL. Pt with PMH of anemia and tobacco abuse. Pt admitted for total loss of vision in R eye; L eye unaffected. Pt is being followed by Hematology and Neurology d/t this with plan for lumbar puncture and further work-up.   Current diet order is Regular. No issues with appetite or episodes of N/V/D or abdominal pain PTA. Labs and medications reviewed. Ensure Enlive ordered BID per ONS protocol. Will decrease to once/day PRN (supplement provides 350 kcal and 20 grams of protein). No nutrition interventions warranted at this time. If nutrition issues arise, please consult RD.     Jarome Matin, MS, RD, LDN, [facility] Inpatient Clinical Dietitian Pager # [phone] After hours/weekend pager # [phone]

## 2017-06-22 NOTE — Consult Note (Signed)
Neurology Consultation  Reason for Consult: Right eye vision loss Referring Physician: A. Kathrynn Humble MD/ Donavan Foil PA-C  CC: Right eye vision loss  History is obtained from: Patient  HPI: Candace Brown is a 46 y.o. female who has a past medical history of anemia and tobacco abuse, presented to the emergency room for evaluation of inability to see from her right eye. She reports that she has been having a headache involving the right side of her head for the past [redacted] weeks along with blurred vision in the right eye but today she lost her vision in the right eye completely, which prompted the ER visit. She does not have a history of migraines.  She reported that she felt like she is having migraine-like headaches as she had heard about the throbbing had a headache that happens with migraine and that is what she was having. Along with that, she started having some blurred vision on the right, she works at a [employer] and noted more problems with her vision while staring at a computer. Denies any known history of MS.  No similar episodes of happened in the past. No preceding fever chills.  No chest pain shortness of breath.  No palpitations.  No abdominal pain nausea vomiting. Denies any recent travel esp to Lyme endemic [place].  ROS: A 14 point ROS was performed and is negative except as noted in the HPI.  History reviewed. No pertinent past medical history.  No family history on file.  Social History:   reports that she has been smoking.  she has never used smokeless tobacco. She reports that she does not drink alcohol or use drugs.  Medications No current facility-administered medications for this encounter.   Current Outpatient Medications:  .  ferrous sulfate 325 (65 FE) MG EC tablet, Take 325 mg daily with breakfast by mouth., Disp: , Rfl:  .  FIBER PO, Take 1 capsule daily by mouth., Disp: , Rfl:  .  Multiple Vitamins-Minerals (MULTIVITAMIN ADULT) TABS, Take 1 tablet daily by  mouth., Disp: , Rfl:   Exam: Current vital signs: BP 132/90   Pulse 68   Temp 98.5 F (36.9 C) (Oral)   Resp 18   LMP 06/19/2017   SpO2 100%  Vital signs in last 24 hours: Temp:  [98.5 F (36.9 C)-98.6 F (37 C)] 98.5 F (36.9 C) (11/15 0036) Pulse Rate:  [60-75] 68 (11/15 0036) Resp:  [14-18] 18 (11/15 0036) BP: (126-132)/(67-90) 132/90 (11/15 0036) SpO2:  [100 %] 100 % (11/15 0036)  GENERAL: Awake, alert in NAD HEENT: - Normocephalic and atraumatic, dry mm, no LN++, no Thyromegally LUNGS - Clear to auscultation bilaterally with no wheezes CV - S1S2 RRR, no m/r/g, equal pulses bilaterally. ABDOMEN - Soft, nontender, nondistended with normoactive BS Ext: warm, well perfused, intact peripheral pulses, no edema  NEURO:  Mental Status: AA&Ox3  Language: speech is clear.  Naming, repetition, fluency, and comprehension intact. Cranial Nerves: left pupil reactive to light, right pupil 102mm, not reactive to light, EOM testing shows disconjugate eye movements with right eye incompletely abducting and incompletely adducting, visual fields full, no facial asymmetry, facial sensation intact, hearing intact, tongue/uvula/soft palate midline, normal sternocleidomastoid and trapezius muscle strength. No evidence of tongue atrophy or fibrillations Motor: 5/5 all over Tone: is normal and bulk is normal Sensation- Intact to light touch bilaterally Coordination: FTN intact bilaterally, no ataxia in BLE. Gait- deferred  Labs I have reviewed labs in epic and the results pertinent to this consultation  are: Repeat hgb 6, Plt 1193K  CBC    Component Value Date/Time   WBC 7.1 06/21/2017 2359   RBC 3.88 06/21/2017 2359   HGB 7.5 (L) 06/22/2017 0016   HCT 22.0 (L) 06/22/2017 0016   PLT 1,193 (HH) 06/21/2017 2359   MCV 58.2 (L) 06/21/2017 2359   MCH 15.5 (L) 06/21/2017 2359   MCHC 26.5 (L) 06/21/2017 2359   RDW 25.7 (H) 06/21/2017 2359   LYMPHSABS PENDING 06/21/2017 2359   MONOABS PENDING  06/21/2017 2359   EOSABS PENDING 06/21/2017 2359   BASOSABS PENDING 06/21/2017 2359    CMP     Component Value Date/Time   NA 142 06/22/2017 0016   K 4.0 06/22/2017 0016   CL 109 06/22/2017 0016   GLUCOSE 94 06/22/2017 0016   BUN 13 06/22/2017 0016   CREATININE 0.60 06/22/2017 0016   Imaging I have reviewed the images obtained MRI examination of the brain shows a infiltrative soft tissue density and some enhancement that involves the right orbital apex, right cavernous sinus and has associated dural thickening and enhancement around the right frontal and temporal lobes with extension along the right tentorium.  Per radiology, differentials include basal meningitides such as TB or sarcoid, idiopathic orbital inflammation such as Tolosa-Hunt, lymphoma or metastatic disease also should be considered.  Another consideration is meningioma.  Also noted cerebellar tonsillar ectopia of about 5 mm without any evidence of frank Chiari malformation  Assessment:  46/F with subacute loss of vision in right eye and headache. PMH significant for anemia. Her exam is significant for complete vision loss in the right eye and decreased extraocular movements of the right eye both in abduction and adduction. Her MRI findings are concerning for an infiltrative density with some enhancement that involves the right orbital apex right cavernous sinus and extends into the right frontal and temporal lobes with extension into the right tentorium This leaves a long differential diagnosis list to be explored.  Differential should include infectious versus inflammatory versus malignant processes.  Impression: Broad differential includes inflammatory, malignant or infectious etiology (sarcoid, lyme, infection, demyelination, lymphoma or mass)  Recommendations: Admit to inpatient LP (either in ER or fluoro guided first thing in the AM) Will recommend doing the regular tests on CSF (glucose, protein, cell count,  bacterial cultures and fungal cultures) and additionally ACE, Lyme, HSV, cytology and flow cytometry. CXR Serum ACE Serum Lyme  Further recs based on results.  --  Amie Portland, MD [facility] [phone] If 7pm to 7am, please call on call as listed on AMION.

## 2017-06-23 ENCOUNTER — Other Ambulatory Visit: Payer: Self-pay | Admitting: Oncology

## 2017-06-23 DIAGNOSIS — D500 Iron deficiency anemia secondary to blood loss (chronic): Secondary | ICD-10-CM

## 2017-06-23 LAB — BPAM RBC
Blood Product Expiration Date: 201812052359
ISSUE DATE / TIME: 201811151009
UNIT TYPE AND RH: 5100

## 2017-06-23 LAB — ENA+DNA/DS+ANTICH+CENTRO+JO...
Anti JO-1: <0.2 AI (ref 0.0–0.9)
Chromatin Ab SerPl-aCnc: 0.8 AI (ref 0.0–0.9)
RIBONUCLEIC PROTEIN: 0.3 AI (ref 0.0–0.9)
SSA (Ro) (ENA) Antibody, IgG: 6.3 AI — ABNORMAL HIGH (ref 0.0–0.9)
SSB (La) (ENA) Antibody, IgG: <0.2 AI (ref 0.0–0.9)
Scleroderma (Scl-70) (ENA) Antibody, IgG: <0.2 AI (ref 0.0–0.9)
ds DNA Ab: <1 IU/mL (ref 0–9)

## 2017-06-23 LAB — TYPE AND SCREEN
ABO/RH(D): O POS
Antibody Screen: NEGATIVE
UNIT DIVISION: 0

## 2017-06-23 LAB — VDRL, CSF: SYPHILIS VDRL QUANT CSF: NONREACTIVE

## 2017-06-23 LAB — ANA W/REFLEX IF POSITIVE: Anti Nuclear Antibody(ANA): POSITIVE — AB

## 2017-06-23 LAB — ANGIOTENSIN CONVERTING ENZYME: Angiotensin-Converting Enzyme: 19 U/L (ref 14–82)

## 2017-06-23 LAB — B. BURGDORFI ANTIBODIES

## 2017-06-23 MED ORDER — OXYCODONE HCL 5 MG PO TABS
5.0000 mg | ORAL_TABLET | Freq: Four times a day (QID) | ORAL | Status: DC | PRN
  Administered 2017-06-23: 5 mg via ORAL
  Filled 2017-06-23: qty 1

## 2017-06-23 MED ORDER — OXYCODONE HCL 5 MG PO TABS
5.0000 mg | ORAL_TABLET | ORAL | Status: DC | PRN
  Administered 2017-06-23: 5 mg via ORAL
  Administered 2017-06-24 – 2017-06-25 (×4): 10 mg via ORAL
  Filled 2017-06-23 (×6): qty 2

## 2017-06-23 NOTE — Progress Notes (Signed)
Candace Brown   DOB:10-08-1970   ER#:[id number]   PPJ#:[id number]  Subjective:  Uneventful night.  Tolerated the LP without unusual headaches.  She is having more pain associated with the right visual loss.  She did well with the Feraheme infusion, with no complications.  Sister in room   Objective: Middle-aged African-American woman examined in bed. Vitals:   06/22/17 2059 06/23/17 0524  BP: (!) 146/72 110/87  Pulse: 62 77  Resp: 16 16  Temp: 98.6 F (37 C) 99.3 F (37.4 C)  SpO2: 100% 100%    Body mass index is 33.87 kg/m.  Intake/Output Summary (Last 24 hours) at 06/23/2017 0847 Last data filed at 06/22/2017 1320 Gross per 24 hour  Intake 412 ml  Output -  Net 412 ml       CBG (last 3)  No results for input(s): GLUCAP in the last 72 hours.   Labs:  Lab Results  Component Value Date   WBC 6.2 06/22/2017   HGB 7.3 (L) 06/22/2017   HCT 25.6 (L) 06/22/2017   MCV 58.7 (L) 06/22/2017   PLT 1,212 (HH) 06/22/2017   NEUTROABS 3.9 06/22/2017    @LASTCHEMISTRY @  Urine Studies No results for input(s): UHGB, CRYS in the last 72 hours.  Invalid input(s): UACOL, UAPR, USPG, UPH, UTP, UGL, UKET, UBIL, UNIT, UROB, ULEU, UEPI, UWBC, URBC, UBAC, CAST, UCOM, BILUA  Basic Metabolic Panel: Recent Labs  Lab 06/22/17 0016 06/22/17 0458  NA 142 138  K 4.0 3.5  CL 109 107  CO2  --  25  GLUCOSE 94 91  BUN 13 10  CREATININE 0.60 0.71  CALCIUM  --  8.9  MG  --  1.9   GFR Estimated Creatinine Clearance: 94.1 mL/min (by C-G formula based on SCr of 0.71 mg/dL). Liver Function Tests: Recent Labs  Lab 06/22/17 0458  AST 33  ALT 14  ALKPHOS 64  BILITOT 0.5  PROT 8.0  ALBUMIN 3.2*   No results for input(s): LIPASE, AMYLASE in the last 168 hours. No results for input(s): AMMONIA in the last 168 hours. Coagulation profile Recent Labs  Lab 06/22/17 0105  INR 1.07    CBC: Recent Labs  Lab 06/21/17 2359 06/22/17 0016 06/22/17 0458 06/22/17 1949  WBC 7.1  --  6.2   --   NEUTROABS 4.0  --  3.9  --   HGB 6.0* 7.5* 6.1* 7.3*  HCT 22.6* 22.0* 23.2* 25.6*  MCV 58.2*  --  58.7*  --   PLT 1,193*  --  1,212*  --    Cardiac Enzymes: No results for input(s): CKTOTAL, CKMB, CKMBINDEX, TROPONINI in the last 168 hours. BNP: Invalid input(s): POCBNP CBG: No results for input(s): GLUCAP in the last 168 hours. D-Dimer No results for input(s): DDIMER in the last 72 hours. Hgb A1c No results for input(s): HGBA1C in the last 72 hours. Lipid Profile No results for input(s): CHOL, HDL, LDLCALC, TRIG, CHOLHDL, LDLDIRECT in the last 72 hours. Thyroid function studies Recent Labs    06/22/17 0458  TSH 1.377   Anemia work up Recent Labs    06/22/17 0105  VITAMINB12 412  FOLATE 18.9  FERRITIN 4*  TIBC 325  IRON 5*  RETICCTPCT 1.0   Microbiology Recent Results (from the past 240 hour(s))  CSF culture     Status: None (Preliminary result)   Collection Time: 06/22/17 11:45 AM  Result Value Ref Range Status   Specimen Description CSF  Final   Special Requests NONE  Final  Gram Stain   Final    WBC PRESENT, PREDOMINANTLY MONONUCLEAR NO ORGANISMS SEEN CYTOSPIN SMEAR Gram Stain Report Called to,Read Back By and Verified With: A.MELTON AT 1517 ON 06/22/17 BY N.THOMPSON    Culture PENDING  Incomplete   Report Status PENDING  Incomplete      Studies:  Dg Chest 2 View  Result Date: 06/22/2017 CLINICAL DATA:  46 year old female with headache and blurred vision. EXAM: CHEST  2 VIEW COMPARISON:  None. FINDINGS: The lungs are clear. There is no pleural effusion or pneumothorax. Top-normal cardiac size. No acute osseous pathology. IMPRESSION: No active cardiopulmonary disease. Electronically Signed   By: Anner Crete M.D.   On: 06/22/2017 03:25   Mr Jeri Cos And Wo Contrast  Result Date: 06/22/2017 CLINICAL DATA:  Initial evaluation for acute migraine headache with right-sided visual loss. EXAM: MRI HEAD AND ORBITS WITHOUT AND WITH CONTRAST TECHNIQUE:  Multiplanar, multiecho pulse sequences of the brain and surrounding structures were obtained without and with intravenous contrast. Multiplanar, multiecho pulse sequences of the orbits and surrounding structures were obtained including fat saturation techniques, before and after intravenous contrast administration. CONTRAST:  85mL MULTIHANCE GADOBENATE DIMEGLUMINE 529 MG/ML IV SOLN COMPARISON:  None available. FINDINGS: MRI HEAD FINDINGS Brain: Cerebral volume within normal limits for age. There is abnormal enhancement with soft tissue thickening involving the right cavernous sinus (series 18, image 8). Associated dural thickening and enhancement extends laterally around the right temporal pole in the right middle cranial fossa, and superiorly about the right frontal lobe (series 16, image 20). Extension along the right tentorium posteriorly. Process encases and narrows the cavernous right ICA. Possible involvement of the pituitary gland which is somewhat lobulated in appearance, with prominent enhancement along the pituitary stalk. Abnormal enhancing soft tissue extends into the right orbital apex, better evaluated on corresponding orbital MRI. Additionally, enhancing soft tissue extends inferiorly into the right sphenoid sinus (Series 15, image 13). T2/FLAIR signal abnormality within the right temporal pole compatible with associated vasogenic edema. No associated diffusion abnormality or susceptibility artifact. Appearance of the brain is otherwise within normal limits. No other focal parenchymal signal abnormality. No significant cerebral white matter disease for age. No evidence for acute or subacute infarct. No encephalomalacia to suggest chronic infarction. No other mass lesion or abnormal enhancement. No hydrocephalus. No extra-axial fluid collection. Major dural sinuses grossly patent. Vascular: Cavernous right ICA narrowed and attenuated but retains a patent flow void. Remainder of the intracranial  vascular flow voids are widely patent and well preserved. 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 Craniocervical junction otherwise unremarkable. Bone marrow signal intensity somewhat diffusely decreased on T1 weighted sequence, most commonly related to anemia, smoking, or obesity. No scalp soft tissue abnormality. Other: Mastoid air cells are clear. Inner ear structures grossly normal. MRI ORBITS FINDINGS Orbits: Globes symmetric in size with normal morphology bilaterally. Abnormal somewhat ill-defined avidly enhancing soft tissue density extends into the right orbital apex, most prominent inferiorly at the origins of the right medial and inferior rectus muscles (series 17, image 17). Again, this extends posteriorly into the right cavernous sinus. Enhancing soft tissue partially encases the right optic nerves at the posterior right orbit, extending posteriorly to the optic chiasm. Superior orbital veins remain within normal limits. Lacrimal glands relatively normal. Is spared and relatively normal in appearance. Visualized sinuses: Abnormal enhancement extends from the right orbital apex towards the right sphenoid sinus. Scattered mucosal thickening within the ethmoid air cells.  Visualized sinuses otherwise clear. Soft tissues: Periorbital soft tissues within normal limits. IMPRESSION: 1. Abnormal infiltrative soft tissue density and enhancement involving the right orbital apex, extending into the right cavernous sinus, with associated dural thickening and enhancement about the right frontal and temporal lobes, with extension along the right tentorium. Associated edema within the anterior right temporal lobe. Findings are nonspecific, with primary differential considerations including possible meningitis, which may be either infectious or inflammatory in nature, as sarcoidosis or TB meningitis could have this appearance. Idiopathic  orbital inflammation (pseudotumor - Tolosa Hunt syndrome) could also be considered. Lymphoma or metastatic disease not entirely excluded. While meningiomas can also involve this region, this is felt to be less likely given the infiltrative nature of this process. Correlation with CSF studies may be helpful for further delineation. 2. 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 3. Otherwise normal brain MRI. Findings were discussed by telephone at the time of interpretation on 06/22/2017 at 12:55 am with Dr. Rory Percy. Electronically Signed   By: Jeannine Boga M.D.   On: 06/22/2017 00:58   Mr Rosealee Albee UU Contrast  Result Date: 06/22/2017 CLINICAL DATA:  Initial evaluation for acute migraine headache with right-sided visual loss. EXAM: MRI HEAD AND ORBITS WITHOUT AND WITH CONTRAST TECHNIQUE: Multiplanar, multiecho pulse sequences of the brain and surrounding structures were obtained without and with intravenous contrast. Multiplanar, multiecho pulse sequences of the orbits and surrounding structures were obtained including fat saturation techniques, before and after intravenous contrast administration. CONTRAST:  43mL MULTIHANCE GADOBENATE DIMEGLUMINE 529 MG/ML IV SOLN COMPARISON:  None available. FINDINGS: MRI HEAD FINDINGS Brain: Cerebral volume within normal limits for age. There is abnormal enhancement with soft tissue thickening involving the right cavernous sinus (series 18, image 8). Associated dural thickening and enhancement extends laterally around the right temporal pole in the right middle cranial fossa, and superiorly about the right frontal lobe (series 16, image 20). Extension along the right tentorium posteriorly. Process encases and narrows the cavernous right ICA. Possible involvement of the pituitary gland which is somewhat lobulated in appearance, with prominent enhancement along the pituitary stalk. Abnormal enhancing soft tissue extends into the  right orbital apex, better evaluated on corresponding orbital MRI. Additionally, enhancing soft tissue extends inferiorly into the right sphenoid sinus (Series 15, image 13). T2/FLAIR signal abnormality within the right temporal pole compatible with associated vasogenic edema. No associated diffusion abnormality or susceptibility artifact. Appearance of the brain is otherwise within normal limits. No other focal parenchymal signal abnormality. No significant cerebral white matter disease for age. No evidence for acute or subacute infarct. No encephalomalacia to suggest chronic infarction. No other mass lesion or abnormal enhancement. No hydrocephalus. No extra-axial fluid collection. Major dural sinuses grossly patent. Vascular: Cavernous right ICA narrowed and attenuated but retains a patent flow void. Remainder of the intracranial vascular flow voids are widely patent and well preserved. 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 Craniocervical junction otherwise unremarkable. Bone marrow signal intensity somewhat diffusely decreased on T1 weighted sequence, most commonly related to anemia, smoking, or obesity. No scalp soft tissue abnormality. Other: Mastoid air cells are clear. Inner ear structures grossly normal. MRI ORBITS FINDINGS Orbits: Globes symmetric in size with normal morphology bilaterally. Abnormal somewhat ill-defined avidly enhancing soft tissue density extends into the right orbital apex, most prominent inferiorly at the origins of the right medial and inferior rectus muscles (series 17, image 17). Again, this extends posteriorly  into the right cavernous sinus. Enhancing soft tissue partially encases the right optic nerves at the posterior right orbit, extending posteriorly to the optic chiasm. Superior orbital veins remain within normal limits. Lacrimal glands relatively normal. Is spared and relatively normal in  appearance. Visualized sinuses: Abnormal enhancement extends from the right orbital apex towards the right sphenoid sinus. Scattered mucosal thickening within the ethmoid air cells. Visualized sinuses otherwise clear. Soft tissues: Periorbital soft tissues within normal limits. IMPRESSION: 1. Abnormal infiltrative soft tissue density and enhancement involving the right orbital apex, extending into the right cavernous sinus, with associated dural thickening and enhancement about the right frontal and temporal lobes, with extension along the right tentorium. Associated edema within the anterior right temporal lobe. Findings are nonspecific, with primary differential considerations including possible meningitis, which may be either infectious or inflammatory in nature, as sarcoidosis or TB meningitis could have this appearance. Idiopathic orbital inflammation (pseudotumor - Tolosa Hunt syndrome) could also be considered. Lymphoma or metastatic disease not entirely excluded. While meningiomas can also involve this region, this is felt to be less likely given the infiltrative nature of this process. Correlation with CSF studies may be helpful for further delineation. 2. 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 3. Otherwise normal brain MRI. Findings were discussed by telephone at the time of interpretation on 06/22/2017 at 12:55 am with Dr. Rory Percy. Electronically Signed   By: Jeannine Boga M.D.   On: 06/22/2017 00:58   Dg Fluoro Guide Lumbar Puncture  Result Date: 06/22/2017 CLINICAL DATA:  Headache and vision abnormality.  Abnormal MRI EXAM: DIAGNOSTIC LUMBAR PUNCTURE UNDER FLUOROSCOPIC GUIDANCE FLUOROSCOPY TIME:  Fluoroscopy Time:  0 minutes 10 seconds Radiation Exposure Index (if provided by the fluoroscopic device): Number of Acquired Spot Images: 0 PROCEDURE: Informed consent was obtained from the patient prior to the procedure, including potential  complications of headache, allergy, and pain. With the patient prone, the lower back was prepped with Betadine. 1% Lidocaine was used for local anesthesia. Lumbar puncture was performed at the L3-4 level using a 20 gauge needle with return of clear CSF with an opening pressure of 24 cm water. Sixteen ml of CSF were obtained for laboratory studies. Closing pressure 13 cm water. The patient tolerated the procedure well and there were no apparent complications. IMPRESSION: Successful lumbar puncture. Electronically Signed   By: Franchot Gallo M.D.   On: 06/22/2017 12:39    Assessment: 46 y.o. 46 y.o. [place] woman presenting with microcytic hypoproliferative anemia and thrombocytosis, as well as Right visual changes and Right orbital pain associated with a CNS infiltrative process by CNS MRI  PLAN:  (1) iron deficiency anemia: patient has been on oral iron supplementation but this has not kept up with her menstrual losses;      (a) received Feraheme dose 06/22/2017 without event    (b) second dose scheduled for 07/05/2017 as outpatient ([facility]) . (2) thrombocytosis: this is reactive, 2d to #1, and should resolve gradually with iron supplementation  (3) Right periorbital infiltrate: patient greports no "B" symptoms by history and has no palpable adenopathy by exam;     (a) s/p LP 06/22/2017, final results pending   Plan:  I reviewed the MRI images with the patient.  She can clearly see the abnormality that we are hoping to diagnose.  If the results of yesterday's lumbar puncture are not definitive she may need neurosurgical intervention for definitive diagnosis  She is complaining of increased right periorbital  pain.  Hopefully this can be managed with nonsteroidals plus or minus tramadol  She tolerated her first dose of Feraheme without event.  She will have her second dose on July 05, 2017.  She is scheduled in our office at 8 AM that day.  We can also follow her LP  results at that time  Please let me know if I can be of further help at this point.   Chauncey Cruel, MD 06/23/2017  8:47 AM Medical Oncology and Hematology [facility] [address] [place], [place] [address] Tel. [phone]    Fax. [phone]

## 2017-06-23 NOTE — Progress Notes (Signed)
Subjective: Mild pain in back of the right eye and still no vision.   Exam: Vitals:   06/22/17 2059 06/23/17 0524  BP: (!) 146/72 110/87  Pulse: 62 77  Resp: 16 16  Temp: 98.6 F (37 C) 99.3 F (37.4 C)  SpO2: 100% 100%    HEENT-  Normocephalic, no lesions, without obvious abnormality.  Normal external eye and conjunctiva.  Normal TM's bilaterally.  Normal auditory canals and external ears. Normal external nose, mucus membranes and septum.  Normal pharynx. Cardiovascular- S1, S2 normal, pulses palpable throughout   Lungs- Heart exam - S1, S2 normal, no murmur, no gallop, rate regular Abdomen- normal findings: bowel sounds normal Extremities- no edema   Neuro:  CN: Patient has no vision in her right eye --cannot see shapes or light.  Full vision in left eye.  Pupils -left pupil is 2 mm briskly reactive, right pupil is approximately 3 mm none reactive .  EOM--left eye has full extraocular movements while the right eye has full capabilities of abducting and abducting however it is limited as she cannot Burry her eye in each corner of the nasal versus the lateral aspect along with limited vertical movement.  Without nystagmus. Facial sensation is intact to light touch. Face is symmetric at rest with normal strength and mobility. Hearing is intact to conversational voice. Palate elevates symmetrically and uvula is midline. Voice is normal in tone, pitch and quality. Bilateral SCM and trapezii are 5/5. Tongue is midline with normal bulk and mobility.  Motor:Normal bulk, tone, and strength. 5/5 throughout.No drift.  Sensation: Intact to light touch.  DTRs:2+, symmetric  Toes downgoing bilaterally. No pathologic reflexes.  Coordination: Finger-to-nose and heel-to-shin are without dysmetria    Medications:  Scheduled:  Continuous: . sodium chloride      Pertinent Labs/Diagnostics: Results for CRISTOL, ENGDAHL (MRN [id number]) as of 06/23/2017 08:42  Ref. Range 06/22/2017 11:45   Appearance, CSF Latest Ref Range: CLEAR  CLEAR  Glucose, CSF Latest Ref Range: 40 - 70 mg/dL 54  RBC Count, CSF Latest Ref Range: 0 /cu mm 0  Other Cells, CSF Unknown TOO FEW TO COUNT,...  Color, CSF Latest Ref Range: COLORLESS  COLORLESS  Supernatant Unknown NOT INDICATED  Total  Protein, CSF Latest Ref Range: 15 - 45 mg/dL 68 (H)  Tube # Unknown 4  WBC, CSF Latest Ref Range: 0 - 5 /cu mm 8 (H)  CSF culture --no growth  Pending---  ACE, Lyme, HSV, TB PCR, cytology and flow cytometry. CXR Serum ACE Serum Lyme       Impression: Candace Brown with subacute loss of vision in right eye and headache.PMH significant for anemia. Her MRI findings are concerning for an infiltrative density with some enhancement that involves the right orbital apex right cavernous sinus and extends into the right frontal and temporal lobes with extension into the right tentorium.  Inflammatory, infiltrative, infectious lesions are all possible.   Thus far LP WNL but awaiting multiple send out labs   Recommendations: 1) awaiting CSF labs 2) if CSF labs nonrevealing with Dr. [name] not will need to get in touch with neurosurgery for possible biopsy   Etta Quill PA-C [facility] [phone]  06/23/2017, 8:42 AM

## 2017-06-23 NOTE — Progress Notes (Addendum)
PROGRESS NOTE  Chanteria Haggard  OAC:[id number] DOB: January 28, 1971 DOA: 06/21/2017 PCP: Patient, No Pcp Per  Brief Narrative: Hiilani Jetter is a 46 y.o. female with a history of tobacco use and microcytic anemia who presented to the ED 11/14 due to progressive loss of vision in the right eye. She reports 1 week of gradual blurring of vision in the right eye only with worsening over 2 days prior to arrival associated with right retro-orbital headache and vision totally blacked out. In the ED, exam showed total loss of vision in right eye with diminished pupillary response. MRI orbits and brain demonstrated an enhancing infiltrative soft tissue density involving the right orbital apex and cavernous sinus, causing dural thickening and extending along the right tentorium. Neurology was consulted and recommended LP which was performed 11/15. Labs were sent out given broad differential diagnosis. Hemoglobin was noted to be low at 6 with platelets elevated at 1.2 million for which hematology was consulted, IV iron and 1u PRBCs were administered.  Assessment & Plan: Principal Problem:   Vision loss of right eye Active Problems:   Thrombocytosis (HCC)   Anemia  Right eye vision loss, painful with infiltrative process on MRI: Broad DDx including inflammatory, infectious, and malignant. Heme/onc not suspicious of NHL/BCL. Protein mildly elevated in CSF, otherwise unremarkable, cytology without malignancy, mixed lymphoid cells present. No acute serology is positive - Appreciate neurology recommendations. Awaiting LP with blood and fluid labs ordered. - If studies are unrevealing, may require neurosurgical involvement for biopsy. - Tylenol prn pain, will add oxyIR prn breakthrough to limit tylenol intake and avoid lowering sizure threshold with tramadol.  - OT eval. - Quantiferon, ANA, lyme titers pending.  Iron-deficiency anemia: Suspect from menorrhagia. Anemia panel shows severe iron deficiency. No schistocytes  on smear. Received IV iron 11/15.  - 1u PRBCs 11/15 with improvement to hgb 7.3. Will recheck in AM.  - Appreciate hematology recommendations, will get IV iron at follow up as outpatient with Dr. [name] 11/28 - Telemetry reviewed, stable NSR, ok to DC cardiac monitoring.  Thrombocytosis: Suspected to be reactive to the anemia.  - Recheck in AM  DVT prophylaxis: SCDs Code Status: Full Family Communication: None at bedside Disposition Plan: DC to home pending evaluation  Consultants:   Neurology  Hematology/oncology  Procedures:   Lumbar puncture 11/15  Antimicrobials:  None   Subjective: Right eye pain is worse, no change in vision. No significant headache from LP.  Objective: Vitals:   06/22/17 1713 06/22/17 2059 06/23/17 0524 06/23/17 1325  BP: 126/76 (!) 146/72 110/87 126/72  Pulse: 60 62 77 71  Resp: 16 16 16 16   Temp: 98.6 F (37 C) 98.6 F (37 C) 99.3 F (37.4 C) 97.8 F (36.6 C)  TempSrc:  Oral Oral Oral  SpO2: 99% 100% 100% 100%  Weight:      Height:       No intake or output data in the 24 hours ending 06/23/17 1330 Filed Weights   06/22/17 0336  Weight: 89.5 kg (197 lb 5 oz)    Gen: 46 y.o. female in no distress Pulm: Non-labored breathing room air. Clear to auscultation bilaterally.  CV: Regular rate and rhythm. No murmur, rub, or gallop. No JVD, no pedal edema. GI: Abdomen soft, non-tender, non-distended, with normoactive bowel sounds. No organomegaly or masses felt. Ext: Warm, no deformities Skin: No rashes, lesions no ulcers Neuro: Alert and oriented. Right eye with no vision whatsoever, vision preserved in left. estricted vertical ROM but able to  adduct past midline and abduct, no nystagmus. Pupil very modestly reactive with intact, right ptosis noted. No other focal deficits in cranial or peripheral nerves.  Psych: Judgement and insight appear normal. Mood & affect appropriate.   Data Reviewed: I have personally reviewed following labs  and imaging studies  CBC: Recent Labs  Lab 06/21/17 2359 06/22/17 0016 06/22/17 0458 06/22/17 1949  WBC 7.1  --  6.2  --   NEUTROABS 4.0  --  3.9  --   HGB 6.0* 7.5* 6.1* 7.3*  HCT 22.6* 22.0* 23.2* 25.6*  MCV 58.2*  --  58.7*  --   PLT 1,193*  --  1,212*  --    Basic Metabolic Panel: Recent Labs  Lab 06/22/17 0016 06/22/17 0458  NA 142 138  K 4.0 3.5  CL 109 107  CO2  --  25  GLUCOSE 94 91  BUN 13 10  CREATININE 0.60 0.71  CALCIUM  --  8.9  MG  --  1.9   GFR: Estimated Creatinine Clearance: 94.1 mL/min (by C-G formula based on SCr of 0.71 mg/dL). Liver Function Tests: Recent Labs  Lab 06/22/17 0458  AST 33  ALT 14  ALKPHOS 64  BILITOT 0.5  PROT 8.0  ALBUMIN 3.2*   No results for input(s): LIPASE, AMYLASE in the last 168 hours. No results for input(s): AMMONIA in the last 168 hours. Coagulation Profile: Recent Labs  Lab 06/22/17 0105  INR 1.07   Cardiac Enzymes: No results for input(s): CKTOTAL, CKMB, CKMBINDEX, TROPONINI in the last 168 hours. BNP (last 3 results) No results for input(s): PROBNP in the last 8760 hours. HbA1C: No results for input(s): HGBA1C in the last 72 hours. CBG: No results for input(s): GLUCAP in the last 168 hours. Lipid Profile: No results for input(s): CHOL, HDL, LDLCALC, TRIG, CHOLHDL, LDLDIRECT in the last 72 hours. Thyroid Function Tests: Recent Labs    06/22/17 0458  TSH 1.377   Anemia Panel: Recent Labs    06/22/17 0105  VITAMINB12 412  FOLATE 18.9  FERRITIN 4*  TIBC 325  IRON 5*  RETICCTPCT 1.0   Urine analysis: No results found for: COLORURINE, APPEARANCEUR, LABSPEC, PHURINE, GLUCOSEU, HGBUR, BILIRUBINUR, KETONESUR, PROTEINUR, UROBILINOGEN, NITRITE, LEUKOCYTESUR Recent Results (from the past 240 hour(s))  CSF culture     Status: None (Preliminary result)   Collection Time: 06/22/17 11:45 AM  Result Value Ref Range Status   Specimen Description CSF  Final   Special Requests NONE  Final   Gram Stain    Final    WBC PRESENT, PREDOMINANTLY MONONUCLEAR NO ORGANISMS SEEN CYTOSPIN SMEAR Gram Stain Report Called to,Read Back By and Verified With: A.MELTON AT 5093 ON 06/22/17 BY N.THOMPSON    Culture   Final    NO GROWTH < 24 HOURS Performed at [facility], [place] [address] [place], [place] [address]    Report Status PENDING  Incomplete      Radiology Studies: Dg Chest 2 View  Result Date: 06/22/2017 CLINICAL DATA:  46 year old female with headache and blurred vision. EXAM: CHEST  2 VIEW COMPARISON:  None. FINDINGS: The lungs are clear. There is no pleural effusion or pneumothorax. Top-normal cardiac size. No acute osseous pathology. IMPRESSION: No active cardiopulmonary disease. Electronically Signed   By: Anner Crete M.D.   On: 06/22/2017 03:25   Mr Jeri Cos And Wo Contrast  Result Date: 06/22/2017 CLINICAL DATA:  Initial evaluation for acute migraine headache with right-sided visual loss. EXAM: MRI HEAD AND ORBITS  WITHOUT AND WITH CONTRAST TECHNIQUE: Multiplanar, multiecho pulse sequences of the brain and surrounding structures were obtained without and with intravenous contrast. Multiplanar, multiecho pulse sequences of the orbits and surrounding structures were obtained including fat saturation techniques, before and after intravenous contrast administration. CONTRAST:  21mL MULTIHANCE GADOBENATE DIMEGLUMINE 529 MG/ML IV SOLN COMPARISON:  None available. FINDINGS: MRI HEAD FINDINGS Brain: Cerebral volume within normal limits for age. There is abnormal enhancement with soft tissue thickening involving the right cavernous sinus (series 18, image 8). Associated dural thickening and enhancement extends laterally around the right temporal pole in the right middle cranial fossa, and superiorly about the right frontal lobe (series 16, image 20). Extension along the right tentorium posteriorly. Process encases and narrows the cavernous right ICA. Possible involvement of the pituitary gland  which is somewhat lobulated in appearance, with prominent enhancement along the pituitary stalk. Abnormal enhancing soft tissue extends into the right orbital apex, better evaluated on corresponding orbital MRI. Additionally, enhancing soft tissue extends inferiorly into the right sphenoid sinus (Series 15, image 13). T2/FLAIR signal abnormality within the right temporal pole compatible with associated vasogenic edema. No associated diffusion abnormality or susceptibility artifact. Appearance of the brain is otherwise within normal limits. No other focal parenchymal signal abnormality. No significant cerebral white matter disease for age. No evidence for acute or subacute infarct. No encephalomalacia to suggest chronic infarction. No other mass lesion or abnormal enhancement. No hydrocephalus. No extra-axial fluid collection. Major dural sinuses grossly patent. Vascular: Cavernous right ICA narrowed and attenuated but retains a patent flow void. Remainder of the intracranial vascular flow voids are widely patent and well preserved. 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 Craniocervical junction otherwise unremarkable. Bone marrow signal intensity somewhat diffusely decreased on T1 weighted sequence, most commonly related to anemia, smoking, or obesity. No scalp soft tissue abnormality. Other: Mastoid air cells are clear. Inner ear structures grossly normal. MRI ORBITS FINDINGS Orbits: Globes symmetric in size with normal morphology bilaterally. Abnormal somewhat ill-defined avidly enhancing soft tissue density extends into the right orbital apex, most prominent inferiorly at the origins of the right medial and inferior rectus muscles (series 17, image 17). Again, this extends posteriorly into the right cavernous sinus. Enhancing soft tissue partially encases the right optic nerves at the posterior right orbit, extending posteriorly to the  optic chiasm. Superior orbital veins remain within normal limits. Lacrimal glands relatively normal. Is spared and relatively normal in appearance. Visualized sinuses: Abnormal enhancement extends from the right orbital apex towards the right sphenoid sinus. Scattered mucosal thickening within the ethmoid air cells. Visualized sinuses otherwise clear. Soft tissues: Periorbital soft tissues within normal limits. IMPRESSION: 1. Abnormal infiltrative soft tissue density and enhancement involving the right orbital apex, extending into the right cavernous sinus, with associated dural thickening and enhancement about the right frontal and temporal lobes, with extension along the right tentorium. Associated edema within the anterior right temporal lobe. Findings are nonspecific, with primary differential considerations including possible meningitis, which may be either infectious or inflammatory in nature, as sarcoidosis or TB meningitis could have this appearance. Idiopathic orbital inflammation (pseudotumor - Tolosa Hunt syndrome) could also be considered. Lymphoma or metastatic disease not entirely excluded. While meningiomas can also involve this region, this is felt to be less likely given the infiltrative nature of this process. Correlation with CSF studies may be helpful for further delineation. 2. 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  3. Otherwise normal brain MRI. Findings were discussed by telephone at the time of interpretation on 06/22/2017 at 12:55 am with Dr. Rory Percy. Electronically Signed   By: Jeannine Boga M.D.   On: 06/22/2017 00:58   Mr Rosealee Albee EN Contrast  Result Date: 06/22/2017 CLINICAL DATA:  Initial evaluation for acute migraine headache with right-sided visual loss. EXAM: MRI HEAD AND ORBITS WITHOUT AND WITH CONTRAST TECHNIQUE: Multiplanar, multiecho pulse sequences of the brain and surrounding structures were obtained without and with  intravenous contrast. Multiplanar, multiecho pulse sequences of the orbits and surrounding structures were obtained including fat saturation techniques, before and after intravenous contrast administration. CONTRAST:  69mL MULTIHANCE GADOBENATE DIMEGLUMINE 529 MG/ML IV SOLN COMPARISON:  None available. FINDINGS: MRI HEAD FINDINGS Brain: Cerebral volume within normal limits for age. There is abnormal enhancement with soft tissue thickening involving the right cavernous sinus (series 18, image 8). Associated dural thickening and enhancement extends laterally around the right temporal pole in the right middle cranial fossa, and superiorly about the right frontal lobe (series 16, image 20). Extension along the right tentorium posteriorly. Process encases and narrows the cavernous right ICA. Possible involvement of the pituitary gland which is somewhat lobulated in appearance, with prominent enhancement along the pituitary stalk. Abnormal enhancing soft tissue extends into the right orbital apex, better evaluated on corresponding orbital MRI. Additionally, enhancing soft tissue extends inferiorly into the right sphenoid sinus (Series 15, image 13). T2/FLAIR signal abnormality within the right temporal pole compatible with associated vasogenic edema. No associated diffusion abnormality or susceptibility artifact. Appearance of the brain is otherwise within normal limits. No other focal parenchymal signal abnormality. No significant cerebral white matter disease for age. No evidence for acute or subacute infarct. No encephalomalacia to suggest chronic infarction. No other mass lesion or abnormal enhancement. No hydrocephalus. No extra-axial fluid collection. Major dural sinuses grossly patent. Vascular: Cavernous right ICA narrowed and attenuated but retains a patent flow void. Remainder of the intracranial vascular flow voids are widely patent and well preserved. Skull and upper cervical spine: Cerebellar tonsillar  ectopia of approximately 5 mm without frank Chiari malformation. Craniocervical junction otherwise unremarkable. Bone marrow signal intensity somewhat diffusely decreased on T1 weighted sequence, most commonly related to anemia, smoking, or obesity. No scalp soft tissue abnormality. Other: Mastoid air cells are clear. Inner ear structures grossly normal. MRI ORBITS FINDINGS Orbits: Globes symmetric in size with normal morphology bilaterally. Abnormal somewhat ill-defined avidly enhancing soft tissue density extends into the right orbital apex, most prominent inferiorly at the origins of the right medial and inferior rectus muscles (series 17, image 17). Again, this extends posteriorly into the right cavernous sinus. Enhancing soft tissue partially encases the right optic nerves at the posterior right orbit, extending posteriorly to the optic chiasm. Superior orbital veins remain within normal limits. Lacrimal glands relatively normal. Is spared and relatively normal in appearance. Visualized sinuses: Abnormal enhancement extends from the right orbital apex towards the right sphenoid sinus. Scattered mucosal thickening within the ethmoid air cells. Visualized sinuses otherwise clear. Soft tissues: Periorbital soft tissues within normal limits. IMPRESSION: 1. Abnormal infiltrative soft tissue density and enhancement involving the right orbital apex, extending into the right cavernous sinus, with associated dural thickening and enhancement about the right frontal and temporal lobes, with extension along the right tentorium. Associated edema within the anterior right temporal lobe. Findings are nonspecific, with primary differential considerations including possible meningitis, which may be either infectious or inflammatory in nature, as sarcoidosis or TB meningitis  could have this appearance. Idiopathic orbital inflammation (pseudotumor - Tolosa Hunt syndrome) could also be considered. Lymphoma or metastatic disease  not entirely excluded. While meningiomas can also involve this region, this is felt to be less likely given the infiltrative nature of this process. Correlation with CSF studies may be helpful for further delineation. 2. 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 3. Otherwise normal brain MRI. Findings were discussed by telephone at the time of interpretation on 06/22/2017 at 12:55 am with Dr. Rory Percy. Electronically Signed   By: Jeannine Boga M.D.   On: 06/22/2017 00:58   Dg Fluoro Guide Lumbar Puncture  Result Date: 06/22/2017 CLINICAL DATA:  Headache and vision abnormality.  Abnormal MRI EXAM: DIAGNOSTIC LUMBAR PUNCTURE UNDER FLUOROSCOPIC GUIDANCE FLUOROSCOPY TIME:  Fluoroscopy Time:  0 minutes 10 seconds Radiation Exposure Index (if provided by the fluoroscopic device): Number of Acquired Spot Images: 0 PROCEDURE: Informed consent was obtained from the patient prior to the procedure, including potential complications of headache, allergy, and pain. With the patient prone, the lower back was prepped with Betadine. 1% Lidocaine was used for local anesthesia. Lumbar puncture was performed at the L3-4 level using a 20 gauge needle with return of clear CSF with an opening pressure of 24 cm water. Sixteen ml of CSF were obtained for laboratory studies. Closing pressure 13 cm water. The patient tolerated the procedure well and there were no apparent complications. IMPRESSION: Successful lumbar puncture. Electronically Signed   By: Franchot Gallo M.D.   On: 06/22/2017 12:39    Scheduled Meds: Continuous Infusions: . sodium chloride       LOS: 1 day   Time spent: 25 minutes.  Vance Gather, MD [facility] Pager [phone]  If 7PM-7AM, please contact night-coverage [email or web address] Password [email or web address] 06/23/2017, 1:30 PM

## 2017-06-24 MED ORDER — SODIUM CHLORIDE 0.9 % IV SOLN
500.0000 mg | Freq: Two times a day (BID) | INTRAVENOUS | Status: AC
  Administered 2017-06-24 – 2017-06-27 (×6): 500 mg via INTRAVENOUS
  Filled 2017-06-24 (×6): qty 4

## 2017-06-24 NOTE — Progress Notes (Signed)
Pt was in bed, awake and alert during our visit. Her fiance was bedside (two visitors joined as we were concluding our visit). An electronic request prompted my visit for an AD. Pt was not interest in completing at this point, but took a copy to read over and review. [place] invited her to pg if she has any questions or needs it administered. Please page Chaplain at [phone] if additional support is needed. [place], MDiv   06/24/17 1400  Clinical Encounter Type  Visited With Patient and family together

## 2017-06-24 NOTE — Progress Notes (Signed)
Candace Brown   DOB:[birth year]   VF#:[id number]   [id number]  Subjective:  Feels a little weak, but tells me she has ambulated in halls. No complications from LP or feraheme. Mick Sell in room   Objective: Middle-aged African-American woman examined in bed. Vitals:   06/23/17 2057 06/24/17 0507  BP: 129/67 121/66  Pulse: 65 77  Resp: 16 16  Temp: 99.1 F (37.3 C) 99.5 F (37.5 C)  SpO2: 99% 100%    Body mass index is 33.87 kg/m. No intake or output data in the 24 hours ending 06/24/17 0936  No vision R eye, L eye vision unremarkable  CBG (last 3)  No results for input(s): GLUCAP in the last 72 hours.   Labs:  Lab Results  Component Value Date   WBC 6.2 06/22/2017   HGB 7.3 (L) 06/22/2017   HCT 25.6 (L) 06/22/2017   MCV 58.7 (L) 06/22/2017   PLT 1,212 (HH) 06/22/2017   NEUTROABS 3.9 06/22/2017    '@LASTCHEMISTRY'$ @  Urine Studies No results for input(s): UHGB, CRYS in the last 72 hours.  Invalid input(s): UACOL, UAPR, USPG, UPH, UTP, UGL, UKET, UBIL, UNIT, UROB, ULEU, UEPI, UWBC, URBC, UBAC, CAST, UCOM, BILUA  Basic Metabolic Panel: Recent Labs  Lab 06/22/17 0016 06/22/17 0458  NA 142 138  K 4.0 3.5  CL 109 107  CO2  --  25  GLUCOSE 94 91  BUN 13 10  CREATININE 0.60 0.71  CALCIUM  --  8.9  MG  --  1.9   GFR Estimated Creatinine Clearance: 94.1 mL/min (by C-G formula based on SCr of 0.71 mg/dL). Liver Function Tests: Recent Labs  Lab 06/22/17 0458  AST 33  ALT 14  ALKPHOS 64  BILITOT 0.5  PROT 8.0  ALBUMIN 3.2*   No results for input(s): LIPASE, AMYLASE in the last 168 hours. No results for input(s): AMMONIA in the last 168 hours. Coagulation profile Recent Labs  Lab 06/22/17 0105  INR 1.07    CBC: Recent Labs  Lab 06/21/17 2359 06/22/17 0016 06/22/17 0458 06/22/17 1949  WBC 7.1  --  6.2  --   NEUTROABS 4.0  --  3.9  --   HGB 6.0* 7.5* 6.1* 7.3*  HCT 22.6* 22.0* 23.2* 25.6*  MCV 58.2*  --  58.7*  --   PLT 1,193*  --  1,212*  --     Cardiac Enzymes: No results for input(s): CKTOTAL, CKMB, CKMBINDEX, TROPONINI in the last 168 hours. BNP: Invalid input(s): POCBNP CBG: No results for input(s): GLUCAP in the last 168 hours. D-Dimer No results for input(s): DDIMER in the last 72 hours. Hgb A1c No results for input(s): HGBA1C in the last 72 hours. Lipid Profile No results for input(s): CHOL, HDL, LDLCALC, TRIG, CHOLHDL, LDLDIRECT in the last 72 hours. Thyroid function studies Recent Labs    06/22/17 0458  TSH 1.377   Anemia work up Recent Labs    06/22/17 0105  VITAMINB12 412  FOLATE 18.9  FERRITIN 4*  TIBC 325  IRON 5*  RETICCTPCT 1.0   Microbiology Recent Results (from the past 240 hour(s))  CSF culture     Status: None (Preliminary result)   Collection Time: 06/22/17 11:45 AM  Result Value Ref Range Status   Specimen Description CSF  Final   Special Requests NONE  Final   Gram Stain   Final    WBC PRESENT, PREDOMINANTLY MONONUCLEAR NO ORGANISMS SEEN CYTOSPIN SMEAR Gram Stain Report Called to,Read Back By and Verified With: A.MELTON  AT 1443 ON 06/22/17 BY N.THOMPSON    Culture   Final    NO GROWTH < 24 HOURS Performed at [facility], [place] [address], [place], [place] [address]    Report Status PENDING  Incomplete  Culture, fungus without smear     Status: None (Preliminary result)   Collection Time: 06/22/17 11:45 AM  Result Value Ref Range Status   Specimen Description CSF  Final   Special Requests NONE  Final   Culture   Final    NO FUNGUS ISOLATED AFTER 1 DAY Performed at [facility], [place] [address], [place], [place] [address]    Report Status PENDING  Incomplete      Studies:  Dg Fluoro Guide Lumbar Puncture  Result Date: 06/22/2017 CLINICAL DATA:  Headache and vision abnormality.  Abnormal MRI EXAM: DIAGNOSTIC LUMBAR PUNCTURE UNDER FLUOROSCOPIC GUIDANCE FLUOROSCOPY TIME:  Fluoroscopy Time:  0 minutes 10 seconds Radiation Exposure Index (if provided by the  fluoroscopic device): Number of Acquired Spot Images: 0 PROCEDURE: Informed consent was obtained from the patient prior to the procedure, including potential complications of headache, allergy, and pain. With the patient prone, the lower back was prepped with Betadine. 1% Lidocaine was used for local anesthesia. Lumbar puncture was performed at the L3-4 level using a 20 gauge needle with return of clear CSF with an opening pressure of 24 cm water. Sixteen ml of CSF were obtained for laboratory studies. Closing pressure 13 cm water. The patient tolerated the procedure well and there were no apparent complications. IMPRESSION: Successful lumbar puncture. Electronically Signed   By: Franchot Gallo M.D.   On: 06/22/2017 12:39    Assessment: 47 y.o. 46 y.o. [place] woman presenting with microcytic hypoproliferative anemia and thrombocytosis, as well as Right visual changes and Right orbital pain associated with a CNS infiltrative process by CNS MRI  PLAN:  (1) iron deficiency anemia: patient has been on oral iron supplementation but this has not kept up with her menstrual losses;      (a) received Feraheme dose 06/22/2017 without event    (b) second dose scheduled for 07/05/2017 as outpatient ([place]) . (2) thrombocytosis: this is reactive, 2d to #1, and should resolve gradually with iron supplementation  (3) Right periorbital infiltrate: patient greports no "B" symptoms by history and has no palpable adenopathy by exam;     (a) s/p LP 06/22/2017, shows slightly elevated protein, normal glc, negative Gram stain, negative VDRL, 8 WBC/mL mostly lymphs, cytology shows no malignancy; other labs pending with cultures negative to date,     (b) ANA is positive, reflex pending; ESR 64, CRP 1,9  Plan:  From a hematology point of view no further w/u is needed. She will see me 11/28 to receive a second feraheme infusion and check CBC. We will follow-her long-term for iron deficiency  I  remain perplexed re the Right eye infiltrate. Not sure I can connect it to her positive ANA (titer pending). I would suggest neurosurgical consultation for possible biopsy.  Will follow with you   Chauncey Cruel, MD 06/24/2017  9:36 AM Medical Oncology and Hematology [facility] [address] [place], [place] [address] Tel. [phone]    Fax. [phone]

## 2017-06-24 NOTE — Progress Notes (Signed)
PROGRESS NOTE    Candace Brown  LKJ:[id number] DOB: June 05, 1971 DOA: 06/21/2017 PCP: Patient, No Pcp Per   Brief Narrative:  Candace Brown V89 y.o.femalewith Ocean Schildt history of tobacco use and microcytic anemia who presented to the ED 11/14 due to progressive loss of vision in the right eye. She reports 1 week of gradual blurring of vision in the right eye only with worsening over 2 days prior to arrival associated with right retro-orbital headache and vision totally blacked out. In the ED, exam showed total loss of vision in right eye with diminished pupillary response. MRI orbits and brain demonstrated an enhancing infiltrative soft tissue density involving the right orbital apex and cavernous sinus, causing dural thickening and extending along the right tentorium. Neurology was consulted and recommended LP which was performed 11/15. Labs were sent out given broad differential diagnosis. Hemoglobin was noted to be low at 6 with platelets elevated at 1.2 million for which hematology was consulted, IV iron and 1u PRBCs were administered.  Assessment & Plan:   Principal Problem:   Vision loss of right eye Active Problems:   Thrombocytosis (HCC)   Anemia   Right eye vision loss, painful with infiltrative process on MRI: Broad DDx including inflammatory, infectious, and malignant. Heme/onc not suspicious of NHL/BCL. Protein mildly elevated in CSF, otherwise unremarkable, cytology without malignancy, mixed lymphoid cells present. No acute serology is positive '[ ]'$  f/u CSF cx '[ ]'$  f/u quant gold Elevated CRP, ESR.  Positive ANA, elevated SSA.   - Appreciate neurology recommendations. Awaiting LP with blood and fluid labs ordered. - If studies are unrevealing, may require neurosurgical involvement for biopsy. - Tylenol prn pain, will add oxyIR prn breakthrough to limit tylenol intake and avoid lowering sizure threshold with tramadol.  - OT eval. - Quantiferon pending, ANA positive as above, lyme  titers negative.  Iron-deficiency anemia: Suspect from menorrhagia. Anemia panel shows severe iron deficiency. No schistocytes on smear. Received IV iron 11/15.  - 1u PRBCs 11/15 with improvement to hgb 7.3 - Appreciate hematology recommendations, will get IV iron at follow up as outpatient with Dr. [name] 11/28 - Telemetry reviewed, stable NSR, ok to DC cardiac monitoring.  Thrombocytosis: Suspected to be reactive to the anemia.  - continue to monitor  DVT prophylaxis: SCD Code Status: full  Family Communication: fiance at bedsised Disposition Plan: pending improvement, decision on biospy  Consultants:   Neurology, neurosurgery, heme  Procedures: (Don't include imaging studies which can be auto populated. Include things that cannot be auto populated i.e. Echo, Carotid and venous dopplers, Foley, Bipap, HD, tubes/drains, wound vac, central lines etc)  none  Antimicrobials: (specify start and planned stop date. Auto populated tables are space occupying and do not give end dates)  none    Subjective: Symptoms are stable.  No new symptoms. Blurry vision Vasili Fok few days before presentation.  Objective: Vitals:   06/23/17 0524 06/23/17 1325 06/23/17 2057 06/24/17 0507  BP: 110/87 126/72 129/67 121/66  Pulse: 77 71 65 77  Resp: '16 16 16 16  '$ Temp: 99.3 F (37.4 C) 97.8 F (36.6 C) 99.1 F (37.3 C) 99.5 F (37.5 C)  TempSrc: Oral Oral Oral Oral  SpO2: 100% 100% 99% 100%  Weight:      Height:       No intake or output data in the 24 hours ending 06/24/17 1038 Filed Weights   06/22/17 0336  Weight: 89.5 kg (197 lb 5 oz)    Examination:  General exam: Appears calm and  comfortable  Respiratory system: Clear to auscultation. Respiratory effort normal. Cardiovascular system: S1 & S2 heard, RRR. No JVD, murmurs, rubs, gallops or clicks. No pedal edema. Gastrointestinal system: Abdomen is nondistended, soft and nontender. No organomegaly or masses felt. Normal bowel sounds  heard. Central nervous system: Alert and oriented. Unresponsive R pupil to light. Able to abduct/adduct, limited vertical ROM. Extremities: Symmetric 5 x 5 power. Skin: No rashes, lesions or ulcers Psychiatry: Judgement and insight appear normal. Mood & affect appropriate.     Data Reviewed: I have personally reviewed following labs and imaging studies  CBC: Recent Labs  Lab 06/21/17 2359 06/22/17 0016 06/22/17 0458 06/22/17 1949  WBC 7.1  --  6.2  --   NEUTROABS 4.0  --  3.9  --   HGB 6.0* 7.5* 6.1* 7.3*  HCT 22.6* 22.0* 23.2* 25.6*  MCV 58.2*  --  58.7*  --   PLT 1,193*  --  1,212*  --    Basic Metabolic Panel: Recent Labs  Lab 06/22/17 0016 06/22/17 0458  NA 142 138  K 4.0 3.5  CL 109 107  CO2  --  25  GLUCOSE 94 91  BUN 13 10  CREATININE 0.60 0.71  CALCIUM  --  8.9  MG  --  1.9   GFR: Estimated Creatinine Clearance: 94.1 mL/min (by C-G formula based on SCr of 0.71 mg/dL). Liver Function Tests: Recent Labs  Lab 06/22/17 0458  AST 33  ALT 14  ALKPHOS 64  BILITOT 0.5  PROT 8.0  ALBUMIN 3.2*   No results for input(s): LIPASE, AMYLASE in the last 168 hours. No results for input(s): AMMONIA in the last 168 hours. Coagulation Profile: Recent Labs  Lab 06/22/17 0105  INR 1.07   Cardiac Enzymes: No results for input(s): CKTOTAL, CKMB, CKMBINDEX, TROPONINI in the last 168 hours. BNP (last 3 results) No results for input(s): PROBNP in the last 8760 hours. HbA1C: No results for input(s): HGBA1C in the last 72 hours. CBG: No results for input(s): GLUCAP in the last 168 hours. Lipid Profile: No results for input(s): CHOL, HDL, LDLCALC, TRIG, CHOLHDL, LDLDIRECT in the last 72 hours. Thyroid Function Tests: Recent Labs    06/22/17 0458  TSH 1.377   Anemia Panel: Recent Labs    06/22/17 0105  VITAMINB12 412  FOLATE 18.9  FERRITIN 4*  TIBC 325  IRON 5*  RETICCTPCT 1.0   Sepsis Labs: No results for input(s): PROCALCITON, LATICACIDVEN in the  last 168 hours.  Recent Results (from the past 240 hour(s))  CSF culture     Status: None (Preliminary result)   Collection Time: 06/22/17 11:45 AM  Result Value Ref Range Status   Specimen Description CSF  Final   Special Requests NONE  Final   Gram Stain   Final    WBC PRESENT, PREDOMINANTLY MONONUCLEAR NO ORGANISMS SEEN CYTOSPIN SMEAR Gram Stain Report Called to,Read Back By and Verified With: Tolulope Pinkett.[email or web address] AT 9147 ON 06/22/17 BY N.THOMPSON    Culture   Final    NO GROWTH < 24 HOURS Performed at [facility], [place] [address] [place], [place] [address]    Report Status PENDING  Incomplete  Culture, fungus without smear     Status: None (Preliminary result)   Collection Time: 06/22/17 11:45 AM  Result Value Ref Range Status   Specimen Description CSF  Final   Special Requests NONE  Final   Culture   Final    NO FUNGUS ISOLATED AFTER 1 DAY Performed  at [facility], [place] [address] [place], [place] [address]    Report Status PENDING  Incomplete         Radiology Studies: Dg Fluoro Guide Lumbar Puncture  Result Date: 06/22/2017 CLINICAL DATA:  Headache and vision abnormality.  Abnormal MRI EXAM: DIAGNOSTIC LUMBAR PUNCTURE UNDER FLUOROSCOPIC GUIDANCE FLUOROSCOPY TIME:  Fluoroscopy Time:  0 minutes 10 seconds Radiation Exposure Index (if provided by the fluoroscopic device): Number of Acquired Spot Images: 0 PROCEDURE: Informed consent was obtained from the patient prior to the procedure, including potential complications of headache, allergy, and pain. With the patient prone, the lower back was prepped with Betadine. 1% Lidocaine was used for local anesthesia. Lumbar puncture was performed at the L3-4 level using Reesha Debes 20 gauge needle with return of clear CSF with an opening pressure of 24 cm water. Sixteen ml of CSF were obtained for laboratory studies. Closing pressure 13 cm water. The patient tolerated the procedure well and there were no apparent complications. IMPRESSION:  Successful lumbar puncture. Electronically Signed   By: Franchot Gallo M.D.   On: 06/22/2017 12:39        Scheduled Meds: Continuous Infusions: . sodium chloride       LOS: 2 days    Time spent: over 30 minutes    Fayrene Helper, MD [facility] Pager [phone]  If 7PM-7AM, please contact night-coverage [email or web address] Password [email or web address] 06/24/2017, 10:38 AM

## 2017-06-24 NOTE — Evaluation (Addendum)
Occupational Therapy Evaluation Patient Details Name: Candace Brown MRN: [id number] DOB: 05-01-1971 Today's Date: 06/24/2017    History of Present Illness Candace Brown is a 46 y.o. female with a history of tobacco use and microcytic anemia who presented to the ED 11/14 due to progressive loss of vision in the right eye. She reports 1 week of gradual blurring of vision in the right eye only with worsening over 2 days prior to arrival associated with right retro-orbital headache and vision totally blacked out. In the ED, exam showed total loss of vision in right eye with diminished pupillary response. MRI orbits and brain demonstrated an enhancing infiltrative soft tissue density involving the right orbital apex and cavernous sinus, causing dural thickening and extending along the right tentorium   Clinical Impression   Pt admitted with vision loss R eye. Pt currently with functional limitations due to the deficits listed below (see OT Problem List).  Pt will benefit from skilled OT to increase their safety and independence with ADL and functional mobility for ADL to facilitate discharge to venue listed below.      Follow Up Recommendations  Outpatient OT(depending on progress)    Equipment Recommendations  None recommended by OT       Precautions / Restrictions Precautions Precautions: Fall      Mobility Bed Mobility Overal bed mobility: Modified Independent                Transfers Overall transfer level: Needs assistance   Transfers: Sit to/from Stand;Stand Pivot Transfers           General transfer comment: pt needs VC to look R at times.  vision on right is absent and pt just getting accustomed to this        ADL either performed or assessed with clinical judgement   ADL Overall ADL's : Needs assistance/impaired                                       General ADL Comments: Pt overall S with ADL activity. Pt does not have vision in R eye at tall.   Educated on turning head to compensate for R eye vision loss.  Pt reported getting up to to to bathroom .  Pt did not want to talk with OT at this time     Vision Patient Visual Report: Pt with no vision in R eye              Pertinent Vitals/Pain Pain Assessment: 0-10 Pain Score: 2  Pain Location: R eye area Pain Descriptors / Indicators: Aching;Discomfort Pain Intervention(s): Limited activity within patient's tolerance     Hand Dominance     Extremity/Trunk Assessment Upper Extremity Assessment Upper Extremity Assessment: Overall WFL for tasks assessed           Communication Communication Communication: No difficulties   Cognition Arousal/Alertness: Awake/alert Behavior During Therapy: WFL for tasks assessed/performed Overall Cognitive Status: Within Functional Limits for tasks assessed                                                Home Living Family/patient expects to be discharged to:: Private residence  Prior Functioning/Environment Level of Independence: Independent                 OT Problem List: Impaired vision/perception      OT Treatment/Interventions: Self-care/ADL training;Patient/family education;DME and/or AE instruction    OT Goals(Current goals can be found in the care plan section) Acute Rehab OT Goals Patient Stated Goal: get vision back OT Goal Formulation: With patient Time For Goal Achievement: 07/01/17 Potential to Achieve Goals: Good  OT Frequency: Min 2X/week              AM-PAC PT "6 Clicks" Daily Activity     Outcome Measure Help from another person eating meals?: None Help from another person taking care of personal grooming?: None Help from another person toileting, which includes using toliet, bedpan, or urinal?: A Little Help from another person bathing (including washing, rinsing, drying)?: A Little Help from another person to put on and  taking off regular upper body clothing?: None Help from another person to put on and taking off regular lower body clothing?: A Little 6 Click Score: 21   End of Session Nurse Communication: Mobility status  Activity Tolerance: Patient limited by lethargy Patient left: in bed;with call bell/phone within reach  OT Visit Diagnosis: Other (comment)(visual impairment)                Time: 2025-4270 OT Time Calculation (min): 15 min Charges:  OT General Charges $OT Visit: 1 Visit OT Evaluation $OT Eval Moderate Complexity: 1 Mod G-Codes:     Kari Baars, OT [phone]    Payton Mccallum D 06/24/2017, 2:33 PM

## 2017-06-25 ENCOUNTER — Encounter (HOSPITAL_COMMUNITY): Payer: Self-pay | Admitting: Radiology

## 2017-06-25 ENCOUNTER — Inpatient Hospital Stay (HOSPITAL_COMMUNITY): Payer: No Typology Code available for payment source

## 2017-06-25 LAB — BASIC METABOLIC PANEL
Anion gap: 8 (ref 5–15)
BUN: 15 mg/dL (ref 6–20)
CALCIUM: 9.9 mg/dL (ref 8.9–10.3)
CO2: 24 mmol/L (ref 22–32)
CREATININE: 0.72 mg/dL (ref 0.44–1.00)
Chloride: 108 mmol/L (ref 101–111)
GFR calc non Af Amer: >60 mL/min (ref >60)
Glucose, Bld: 154 mg/dL — ABNORMAL HIGH (ref 65–99)
POTASSIUM: 4 mmol/L (ref 3.5–5.1)
SODIUM: 140 mmol/L (ref 135–145)

## 2017-06-25 LAB — CSF CULTURE W GRAM STAIN

## 2017-06-25 LAB — CBC
HCT: 28.6 % — ABNORMAL LOW (ref 36.0–46.0)
Hemoglobin: 8 g/dL — ABNORMAL LOW (ref 12.0–15.0)
MCH: 17.2 pg — AB (ref 26.0–34.0)
MCHC: 28 g/dL — AB (ref 30.0–36.0)
MCV: 61.4 fL — ABNORMAL LOW (ref 78.0–100.0)
PLATELETS: 1246 10*3/uL — AB (ref 150–400)
RBC: 4.66 MIL/uL (ref 3.87–5.11)
RDW: 27.7 % — ABNORMAL HIGH (ref 11.5–15.5)
WBC: 7.1 10*3/uL (ref 4.0–10.5)

## 2017-06-25 LAB — QUANTIFERON-TB GOLD PLUS (RQFGPL)
QUANTIFERON MITOGEN VALUE: 4.72 [IU]/mL
QUANTIFERON NIL VALUE: 0.02 [IU]/mL
QuantiFERON TB1 Ag Value: 0.02 IU/mL
QuantiFERON TB2 Ag Value: 0.02 IU/mL

## 2017-06-25 LAB — QUANTIFERON-TB GOLD PLUS: QUANTIFERON-TB GOLD PLUS: NEGATIVE

## 2017-06-25 LAB — GLUCOSE, CAPILLARY
GLUCOSE-CAPILLARY: 203 mg/dL — AB (ref 65–99)
GLUCOSE-CAPILLARY: 162 mg/dL — AB (ref 65–99)
Glucose-Capillary: 115 mg/dL — ABNORMAL HIGH (ref 65–99)

## 2017-06-25 LAB — HEMOGLOBIN A1C
Hgb A1c MFr Bld: 5.5 % (ref 4.8–5.6)
Mean Plasma Glucose: 111.15 mg/dL

## 2017-06-25 LAB — CSF CULTURE: CULTURE: NO GROWTH

## 2017-06-25 MED ORDER — IOPAMIDOL (ISOVUE-300) INJECTION 61%
75.0000 mL | Freq: Once | INTRAVENOUS | Status: AC | PRN
  Administered 2017-06-25: 75 mL via INTRAVENOUS

## 2017-06-25 MED ORDER — INSULIN ASPART 100 UNIT/ML ~~LOC~~ SOLN
0.0000 [IU] | Freq: Three times a day (TID) | SUBCUTANEOUS | Status: DC
  Administered 2017-06-25: 3 [IU] via SUBCUTANEOUS
  Administered 2017-06-26: 1 [IU] via SUBCUTANEOUS
  Administered 2017-06-26: 2 [IU] via SUBCUTANEOUS
  Administered 2017-06-26: 3 [IU] via SUBCUTANEOUS
  Administered 2017-06-27 (×3): 2 [IU] via SUBCUTANEOUS
  Administered 2017-06-28: 1 [IU] via SUBCUTANEOUS
  Administered 2017-06-28 – 2017-06-29 (×2): 2 [IU] via SUBCUTANEOUS
  Administered 2017-06-29: 1 [IU] via SUBCUTANEOUS

## 2017-06-25 MED ORDER — INSULIN ASPART 100 UNIT/ML ~~LOC~~ SOLN
0.0000 [IU] | Freq: Every day | SUBCUTANEOUS | Status: DC
  Administered 2017-06-26 – 2017-06-29 (×2): 2 [IU] via SUBCUTANEOUS

## 2017-06-25 MED ORDER — IOPAMIDOL (ISOVUE-300) INJECTION 61%
INTRAVENOUS | Status: AC
  Filled 2017-06-25: qty 75

## 2017-06-25 NOTE — Progress Notes (Signed)
CRITICAL VALUE ALERT  Critical Value:  Platelet level 1246  Date & Time Notied:  06/25/17 @ 0638  Provider Notified: Jeannette Corpus  Orders Received/Actions taken:

## 2017-06-25 NOTE — Progress Notes (Signed)
Subjective: Patient reports marked improvement in her pain.  No change in her vision  Exam: Vitals:   06/24/17 2010 06/25/17 0439  BP: (!) 151/75 112/68  Pulse: 74 66  Resp: 16 16  Temp: 99.4 F (37.4 C)   SpO2: 100% 98%   Gen: In bed, NAD Resp: non-labored breathing, no acute distress Abd: soft, nt  Neuro: MS: Awake, alert, interactive and appropriate CN: Right pupil is nonreactive to direct light.  She has limitations of downgaze, lateral gaze both sides.  She has decreased sensation in the V2 distribution. 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  Her improvement in pain this quickly with steroids I think supports Tolosa-Hunt syndrome, but the differential is still broad including sarcoidosis, lymphoma.  I think with relatively low CSF WBC infection is much less likely.  Recommendations: 1) complete 3 days of IV Solu-Medrol, 500 mg 3 times daily  2) I would favor continued oral steroid following this, 60 mg daily 3) if she fails to respond, then biopsy still would likely need to be considered, given that it extends into the ethmoid sinus this would likely be the easiest place to biopsy, and could consult ENT for that. 4) chest CT to look for lymphadenopathy that could suggest sarcoidosis.  Roland Rack, MD [facility] [phone]  If 7pm- 7am, please page neurology on call as listed in [place].

## 2017-06-25 NOTE — Progress Notes (Signed)
Candace Brown   DOB:1970/12/08   GY#:[id number]   [id number]  Subjective:  Tells me she has less eye pain-- not using pain meds since steroids started. No improvement in R-eye vision. Otherwise feels "OK," tells me she ambulated in halls yesterday w/o event; SO in room  Objective: Middle-aged African-American woman examined in bed Vitals:   06/24/17 2010 06/25/17 0439  BP: (!) 151/75 112/68  Pulse: 74 66  Resp: 16 16  Temp: 99.4 F (37.4 C)   SpO2: 100% 98%    Body mass index is 33.87 kg/m.  Intake/Output Summary (Last 24 hours) at 06/25/2017 0857 Last data filed at 06/24/2017 1820 Gross per 24 hour  Intake 54 ml  Output -  Net 54 ml    Still no vision R eye  CBG (last 3)  No results for input(s): GLUCAP in the last 72 hours.   Labs:  Lab Results  Component Value Date   WBC 7.1 06/25/2017   HGB 8.0 (L) 06/25/2017   HCT 28.6 (L) 06/25/2017   MCV 61.4 (L) 06/25/2017   PLT 1,246 (HH) 06/25/2017   NEUTROABS 3.9 06/22/2017    '@LASTCHEMISTRY'$ @  Urine Studies No results for input(s): UHGB, CRYS in the last 72 hours.  Invalid input(s): UACOL, UAPR, USPG, UPH, UTP, UGL, UKET, UBIL, UNIT, UROB, ULEU, UEPI, UWBC, URBC, UBAC, CAST, [place], [place]  Basic Metabolic Panel: Recent Labs  Lab 06/22/17 0016 06/22/17 0458 06/25/17 0514  NA 142 138 140  K 4.0 3.5 4.0  CL 109 107 108  CO2  --  25 24  GLUCOSE 94 91 154*  BUN '13 10 15  '$ CREATININE 0.60 0.71 0.72  CALCIUM  --  8.9 9.9  MG  --  1.9  --    GFR Estimated Creatinine Clearance: 94.1 mL/min (by C-G formula based on SCr of 0.72 mg/dL). Liver Function Tests: Recent Labs  Lab 06/22/17 0458  AST 33  ALT 14  ALKPHOS 64  BILITOT 0.5  PROT 8.0  ALBUMIN 3.2*   No results for input(s): LIPASE, AMYLASE in the last 168 hours. No results for input(s): AMMONIA in the last 168 hours. Coagulation profile Recent Labs  Lab 06/22/17 0105  INR 1.07    CBC: Recent Labs  Lab 06/21/17 2359 06/22/17 0016  06/22/17 0458 06/22/17 1949 06/25/17 0514  WBC 7.1  --  6.2  --  7.1  NEUTROABS 4.0  --  3.9  --   --   HGB 6.0* 7.5* 6.1* 7.3* 8.0*  HCT 22.6* 22.0* 23.2* 25.6* 28.6*  MCV 58.2*  --  58.7*  --  61.4*  PLT 1,193*  --  1,212*  --  1,246*   Cardiac Enzymes: No results for input(s): CKTOTAL, CKMB, CKMBINDEX, TROPONINI in the last 168 hours. BNP: Invalid input(s): POCBNP CBG: No results for input(s): GLUCAP in the last 168 hours. D-Dimer No results for input(s): DDIMER in the last 72 hours. Hgb A1c No results for input(s): HGBA1C in the last 72 hours. Lipid Profile No results for input(s): CHOL, HDL, LDLCALC, TRIG, CHOLHDL, LDLDIRECT in the last 72 hours. Thyroid function studies No results for input(s): TSH, T4TOTAL, T3FREE, THYROIDAB in the last 72 hours.  Invalid input(s): FREET3 Anemia work up No results for input(s): VITAMINB12, FOLATE, FERRITIN, TIBC, IRON, RETICCTPCT in the last 72 hours. Microbiology Recent Results (from the past 240 hour(s))  Anaerobic culture     Status: None (Preliminary result)   Collection Time: 06/22/17 11:45 AM  Result Value Ref Range Status  Specimen Description CSF  Final   Special Requests NONE  Final   Culture   Final    NO ANAEROBES ISOLATED; CULTURE IN PROGRESS FOR 5 DAYS   Report Status PENDING  Incomplete  CSF culture     Status: None (Preliminary result)   Collection Time: 06/22/17 11:45 AM  Result Value Ref Range Status   Specimen Description CSF  Final   Special Requests NONE  Final   Gram Stain   Final    WBC PRESENT, PREDOMINANTLY MONONUCLEAR NO ORGANISMS SEEN CYTOSPIN SMEAR Gram Stain Report Called to,Read Back By and Verified With: A.MELTON AT 3664 ON 06/22/17 BY N.THOMPSON    Culture   Final    NO GROWTH 2 DAYS Performed at [facility], [place] [address], [place], [place] [address]    Report Status PENDING  Incomplete  Culture, fungus without smear     Status: None (Preliminary result)   Collection Time: 06/22/17  11:45 AM  Result Value Ref Range Status   Specimen Description CSF  Final   Special Requests NONE  Final   Culture   Final    NO FUNGUS ISOLATED AFTER 2 DAYS Performed at [facility], [place] [address] [place], [place] [address]    Report Status PENDING  Incomplete      Studies:  Dg Chest 2 View  Result Date: 06/22/2017 CLINICAL DATA:  46 year old female with headache and blurred vision. EXAM: CHEST  2 VIEW COMPARISON:  None. FINDINGS: The lungs are clear. There is no pleural effusion or pneumothorax. Top-normal cardiac size. No acute osseous pathology. IMPRESSION: No active cardiopulmonary disease. Electronically Signed   By: Anner Crete M.D.   On: 06/22/2017 03:25   Mr Jeri Cos And Wo Contrast  Result Date: 06/22/2017 CLINICAL DATA:  Initial evaluation for acute migraine headache with right-sided visual loss. EXAM: MRI HEAD AND ORBITS WITHOUT AND WITH CONTRAST TECHNIQUE: Multiplanar, multiecho pulse sequences of the brain and surrounding structures were obtained without and with intravenous contrast. Multiplanar, multiecho pulse sequences of the orbits and surrounding structures were obtained including fat saturation techniques, before and after intravenous contrast administration. CONTRAST:  6m MULTIHANCE GADOBENATE DIMEGLUMINE 529 MG/ML IV SOLN COMPARISON:  None available. FINDINGS: MRI HEAD FINDINGS Brain: Cerebral volume within normal limits for age. There is abnormal enhancement with soft tissue thickening involving the right cavernous sinus (series 18, image 8). Associated dural thickening and enhancement extends laterally around the right temporal pole in the right middle cranial fossa, and superiorly about the right frontal lobe (series 16, image 20). Extension along the right tentorium posteriorly. Process encases and narrows the cavernous right ICA. Possible involvement of the pituitary gland which is somewhat lobulated in appearance, with prominent enhancement along the  pituitary stalk. Abnormal enhancing soft tissue extends into the right orbital apex, better evaluated on corresponding orbital MRI. Additionally, enhancing soft tissue extends inferiorly into the right sphenoid sinus (Series 15, image 13). T2/FLAIR signal abnormality within the right temporal pole compatible with associated vasogenic edema. No associated diffusion abnormality or susceptibility artifact. Appearance of the brain is otherwise within normal limits. No other focal parenchymal signal abnormality. No significant cerebral white matter disease for age. No evidence for acute or subacute infarct. No encephalomalacia to suggest chronic infarction. No other mass lesion or abnormal enhancement. No hydrocephalus. No extra-axial fluid collection. Major dural sinuses grossly patent. Vascular: Cavernous right ICA narrowed and attenuated but retains a patent flow void. Remainder of the intracranial vascular flow voids are  widely patent and well preserved. 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 Craniocervical junction otherwise unremarkable. Bone marrow signal intensity somewhat diffusely decreased on T1 weighted sequence, most commonly related to anemia, smoking, or obesity. No scalp soft tissue abnormality. Other: Mastoid air cells are clear. Inner ear structures grossly normal. MRI ORBITS FINDINGS Orbits: Globes symmetric in size with normal morphology bilaterally. Abnormal somewhat ill-defined avidly enhancing soft tissue density extends into the right orbital apex, most prominent inferiorly at the origins of the right medial and inferior rectus muscles (series 17, image 17). Again, this extends posteriorly into the right cavernous sinus. Enhancing soft tissue partially encases the right optic nerves at the posterior right orbit, extending posteriorly to the optic chiasm. Superior orbital veins remain within normal limits. Lacrimal  glands relatively normal. Is spared and relatively normal in appearance. Visualized sinuses: Abnormal enhancement extends from the right orbital apex towards the right sphenoid sinus. Scattered mucosal thickening within the ethmoid air cells. Visualized sinuses otherwise clear. Soft tissues: Periorbital soft tissues within normal limits. IMPRESSION: 1. Abnormal infiltrative soft tissue density and enhancement involving the right orbital apex, extending into the right cavernous sinus, with associated dural thickening and enhancement about the right frontal and temporal lobes, with extension along the right tentorium. Associated edema within the anterior right temporal lobe. Findings are nonspecific, with primary differential considerations including possible meningitis, which may be either infectious or inflammatory in nature, as sarcoidosis or TB meningitis could have this appearance. Idiopathic orbital inflammation (pseudotumor - Tolosa Hunt syndrome) could also be considered. Lymphoma or metastatic disease not entirely excluded. While meningiomas can also involve this region, this is felt to be less likely given the infiltrative nature of this process. Correlation with CSF studies may be helpful for further delineation. 2. 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 3. Otherwise normal brain MRI. Findings were discussed by telephone at the time of interpretation on 06/22/2017 at 12:55 am with Dr. Rory Percy. Electronically Signed   By: Jeannine Boga M.D.   On: 06/22/2017 00:58   Mr Rosealee Albee HY Contrast  Result Date: 06/22/2017 CLINICAL DATA:  Initial evaluation for acute migraine headache with right-sided visual loss. EXAM: MRI HEAD AND ORBITS WITHOUT AND WITH CONTRAST TECHNIQUE: Multiplanar, multiecho pulse sequences of the brain and surrounding structures were obtained without and with intravenous contrast. Multiplanar, multiecho pulse sequences of the orbits and  surrounding structures were obtained including fat saturation techniques, before and after intravenous contrast administration. CONTRAST:  18m MULTIHANCE GADOBENATE DIMEGLUMINE 529 MG/ML IV SOLN COMPARISON:  None available. FINDINGS: MRI HEAD FINDINGS Brain: Cerebral volume within normal limits for age. There is abnormal enhancement with soft tissue thickening involving the right cavernous sinus (series 18, image 8). Associated dural thickening and enhancement extends laterally around the right temporal pole in the right middle cranial fossa, and superiorly about the right frontal lobe (series 16, image 20). Extension along the right tentorium posteriorly. Process encases and narrows the cavernous right ICA. Possible involvement of the pituitary gland which is somewhat lobulated in appearance, with prominent enhancement along the pituitary stalk. Abnormal enhancing soft tissue extends into the right orbital apex, better evaluated on corresponding orbital MRI. Additionally, enhancing soft tissue extends inferiorly into the right sphenoid sinus (Series 15, image 13). T2/FLAIR signal abnormality within the right temporal pole compatible with associated vasogenic edema. No associated diffusion abnormality or susceptibility artifact. Appearance of the brain is otherwise within normal limits.  No other focal parenchymal signal abnormality. No significant cerebral white matter disease for age. No evidence for acute or subacute infarct. No encephalomalacia to suggest chronic infarction. No other mass lesion or abnormal enhancement. No hydrocephalus. No extra-axial fluid collection. Major dural sinuses grossly patent. Vascular: Cavernous right ICA narrowed and attenuated but retains a patent flow void. Remainder of the intracranial vascular flow voids are widely patent and well preserved. 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 Craniocervical  junction otherwise unremarkable. Bone marrow signal intensity somewhat diffusely decreased on T1 weighted sequence, most commonly related to anemia, smoking, or obesity. No scalp soft tissue abnormality. Other: Mastoid air cells are clear. Inner ear structures grossly normal. MRI ORBITS FINDINGS Orbits: Globes symmetric in size with normal morphology bilaterally. Abnormal somewhat ill-defined avidly enhancing soft tissue density extends into the right orbital apex, most prominent inferiorly at the origins of the right medial and inferior rectus muscles (series 17, image 17). Again, this extends posteriorly into the right cavernous sinus. Enhancing soft tissue partially encases the right optic nerves at the posterior right orbit, extending posteriorly to the optic chiasm. Superior orbital veins remain within normal limits. Lacrimal glands relatively normal. Is spared and relatively normal in appearance. Visualized sinuses: Abnormal enhancement extends from the right orbital apex towards the right sphenoid sinus. Scattered mucosal thickening within the ethmoid air cells. Visualized sinuses otherwise clear. Soft tissues: Periorbital soft tissues within normal limits. IMPRESSION: 1. Abnormal infiltrative soft tissue density and enhancement involving the right orbital apex, extending into the right cavernous sinus, with associated dural thickening and enhancement about the right frontal and temporal lobes, with extension along the right tentorium. Associated edema within the anterior right temporal lobe. Findings are nonspecific, with primary differential considerations including possible meningitis, which may be either infectious or inflammatory in nature, as sarcoidosis or TB meningitis could have this appearance. Idiopathic orbital inflammation (pseudotumor - Tolosa Hunt syndrome) could also be considered. Lymphoma or metastatic disease not entirely excluded. While meningiomas can also involve this region, this is  felt to be less likely given the infiltrative nature of this process. Correlation with CSF studies may be helpful for further delineation. 2. 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 3. Otherwise normal brain MRI. Findings were discussed by telephone at the time of interpretation on 06/22/2017 at 12:55 am with Dr. Rory Percy. Electronically Signed   By: Jeannine Boga M.D.   On: 06/22/2017 00:58   Dg Fluoro Guide Lumbar Puncture  Result Date: 06/22/2017 CLINICAL DATA:  Headache and vision abnormality.  Abnormal MRI EXAM: DIAGNOSTIC LUMBAR PUNCTURE UNDER FLUOROSCOPIC GUIDANCE FLUOROSCOPY TIME:  Fluoroscopy Time:  0 minutes 10 seconds Radiation Exposure Index (if provided by the fluoroscopic device): Number of Acquired Spot Images: 0 PROCEDURE: Informed consent was obtained from the patient prior to the procedure, including potential complications of headache, allergy, and pain. With the patient prone, the lower back was prepped with Betadine. 1% Lidocaine was used for local anesthesia. Lumbar puncture was performed at the L3-4 level using a 20 gauge needle with return of clear CSF with an opening pressure of 24 cm water. Sixteen ml of CSF were obtained for laboratory studies. Closing pressure 13 cm water. The patient tolerated the procedure well and there were no apparent complications. IMPRESSION: Successful lumbar puncture. Electronically Signed   By: Franchot Gallo M.D.   On: 06/22/2017 12:39    Assessment: 46 y.o. 46 y.o. [place] woman presenting  with microcytic hypoproliferative anemia and thrombocytosis, as well as Right visual changes and Right orbital pain associated with a CNS infiltrative process by CNS MRI  PLAN:  (1) iron deficiency anemia: patient has been on oral iron supplementation but this has not kept up with her menstrual losses;      (a) received Feraheme dose 06/22/2017 without event    (b) second dose scheduled for 07/05/2017 as  outpatient ([facility]) . (2) thrombocytosis: this is reactive, 2d to #1, and should resolve gradually with iron supplementation  (3) Right periorbital infiltrate: patient greports no "B" symptoms by history and has no palpable adenopathy by exam;     (a) s/p LP 06/22/2017, shows slightly elevated protein, normal glc, negative Gram stain, negative VDRL, 8 WBC/mL mostly lymphs, cytology shows no malignancy; other labs pending with cultures negative to date,     (b) ANA is positive, reflex pending; ESR 64, CRP 1,9    (c) empiric methylprednisolone started 06/24/2017  Plan:  From a heme point of view, Hb is already rising.  She is having less eye pain so at least from that point of view the steroids are helping.  Steroids should be therapeutic if we are dealing with lymphoma or a rheumatologic condition; would be a concern if this is infectious. Note Dr Buford Dresser plans to involve ENT for possible biopsy  Will follow peripherally       Chauncey Cruel, MD 06/25/2017  8:57 AM Medical Oncology and Hematology [facility] [place], [place] [address] Tel. [phone]    Fax. [phone]

## 2017-06-25 NOTE — Progress Notes (Signed)
PROGRESS NOTE    Ahava Kissoon  DJT:[id number] DOB: May 28, 1971 DOA: 06/21/2017 PCP: Patient, No Pcp Per   Brief Narrative:  Dolores Patty Z1 y.o.femalewith Annalisse Minkoff history of tobacco use and microcytic anemia who presented to the ED 11/14 due to progressive loss of vision in the right eye. She reports 1 week of gradual blurring of vision in the right eye only with worsening over 2 days prior to arrival associated with right retro-orbital headache and vision totally blacked out. In the ED, exam showed total loss of vision in right eye with diminished pupillary response. MRI orbits and brain demonstrated an enhancing infiltrative soft tissue density involving the right orbital apex and cavernous sinus, causing dural thickening and extending along the right tentorium. Neurology was consulted and recommended LP which was performed 11/15. Labs were sent out given broad differential diagnosis. Hemoglobin was noted to be low at 6 with platelets elevated at 1.2 million for which hematology was consulted, IV iron and 1u PRBCs were administered.  Assessment & Plan:   Principal Problem:   Vision loss of right eye Active Problems:   Thrombocytosis (HCC)   Anemia   Right eye vision loss, painful with infiltrative process on MRI: Broad DDx including inflammatory, infectious, and malignant. Heme/onc not suspicious of NHL/BCL. Protein mildly elevated in CSF, otherwise unremarkable, cytology without malignancy, mixed lymphoid cells present. No acute serology is positive _0  f/u CSF cx _1  f/u quant gold - negative Elevated CRP, ESR.  Positive ANA (awaiting titers), elevated SSA.   - Appreciate neurology recommendations. Awaiting LP with blood and fluid labs ordered. - If studies are unrevealing, may require neurosurgical involvement for biopsy.   - discussed with neurosurgery yesterday who noted they would wait for final cx prior to bx.  Neurology discussed with neurosurgery as well who noted potentially may  be able to have ENT biopsy given location. - solumedrol 500 mg q12 started 11/17   - Tylenol prn pain, will add oxyIR prn breakthrough to limit tylenol intake and avoid lowering sizure threshold with tramadol.  - OT eval. - Quantiferon pending, ANA positive as above, lyme titers negative.  Iron-deficiency anemia: Suspect from menorrhagia. Anemia panel shows severe iron deficiency. No schistocytes on smear. Received IV iron 11/15.  - 1u PRBCs 11/15 with improvement to hgb 7.3 - Appreciate hematology recommendations, will get IV iron at follow up as outpatient with Dr. [name] 11/28 - Telemetry reviewed, stable NSR, ok to DC cardiac monitoring.  Thrombocytosis: Suspected to be reactive to the anemia.  - continue to monitor  DVT prophylaxis: SCD Code Status: full  Family Communication: fiance at bedsised Disposition Plan: pending improvement, decision on biospy  Consultants:   Neurology, neurosurgery, heme  Procedures: (Don't include imaging studies which can be auto populated. Include things that cannot be auto populated i.e. Echo, Carotid and venous dopplers, Foley, Bipap, HD, tubes/drains, wound vac, central lines etc)  none  Antimicrobials: (specify start and planned stop date. Auto populated tables are space occupying and do not give end dates)  none    Subjective: HA seems better.  Vision is unchanged.  No other complaints.  Objective: Vitals:   06/24/17 0507 06/24/17 1402 06/24/17 2010 06/25/17 0439  BP: 121/66 (!) 116/99 (!) 151/75 112/68  Pulse: 77 61 74 66  Resp: _2 Temp: 99.5 F (37.5 C) 98.4 F (36.9 C) 99.4 F (37.4 C)   TempSrc: Oral Oral Oral Oral  SpO2: 100% 100% 100% 98%  Weight:  Height:        Intake/Output Summary (Last 24 hours) at 06/25/2017 1055 Last data filed at 06/24/2017 1820 Gross per 24 hour  Intake 54 ml  Output -  Net 54 ml   Filed Weights   06/22/17 0336  Weight: 89.5 kg (197 lb 5 oz)     Examination:  General: No acute distress. Cardiovascular: Heart sounds show Keeya Dyckman regular rate, and rhythm. No gallops or rubs. No murmurs. No JVD. Lungs: Clear to auscultation bilaterally with good air movement. No rales, rhonchi or wheezes. Abdomen: Soft, nontender, nondistended with normal active bowel sounds. No masses. No hepatosplenomegaly. Neurological: Alert and oriented 3. Moves all extremities 4 with equal strength. Decreased pupillary response to light on R, EOMI, but limited vertical ROM.   Skin: Warm and dry. No rashes or lesions. Extremities: No clubbing or cyanosis. No edema.  Psychiatric: Mood and affect are normal. Insight and judgment are appropriate.   Data Reviewed: I have personally reviewed following labs and imaging studies  CBC: Recent Labs  Lab 06/21/17 2359 06/22/17 0016 06/22/17 0458 06/22/17 1949 06/25/17 0514  WBC 7.1  --  6.2  --  7.1  NEUTROABS 4.0  --  3.9  --   --   HGB 6.0* 7.5* 6.1* 7.3* 8.0*  HCT 22.6* 22.0* 23.2* 25.6* 28.6*  MCV 58.2*  --  58.7*  --  61.4*  PLT 1,193*  --  1,212*  --  0,093*   Basic Metabolic Panel: Recent Labs  Lab 06/22/17 0016 06/22/17 0458 06/25/17 0514  NA 142 138 140  K 4.0 3.5 4.0  CL 109 107 108  CO2  --  25 24  GLUCOSE 94 91 154*  BUN _0 CREATININE 0.60 0.71 0.72  CALCIUM  --  8.9 9.9  MG  --  1.9  --    GFR: Estimated Creatinine Clearance: 94.1 mL/min (by C-G formula based on SCr of 0.72 mg/dL). Liver Function Tests: Recent Labs  Lab 06/22/17 0458  AST 33  ALT 14  ALKPHOS 64  BILITOT 0.5  PROT 8.0  ALBUMIN 3.2*   No results for input(s): LIPASE, AMYLASE in the last 168 hours. No results for input(s): AMMONIA in the last 168 hours. Coagulation Profile: Recent Labs  Lab 06/22/17 0105  INR 1.07   Cardiac Enzymes: No results for input(s): CKTOTAL, CKMB, CKMBINDEX, TROPONINI in the last 168 hours. BNP (last 3 results) No results for input(s): PROBNP in the last 8760  hours. HbA1C: No results for input(s): HGBA1C in the last 72 hours. CBG: No results for input(s): GLUCAP in the last 168 hours. Lipid Profile: No results for input(s): CHOL, HDL, LDLCALC, TRIG, CHOLHDL, LDLDIRECT in the last 72 hours. Thyroid Function Tests: No results for input(s): TSH, T4TOTAL, FREET4, T3FREE, THYROIDAB in the last 72 hours. Anemia Panel: No results for input(s): VITAMINB12, FOLATE, FERRITIN, TIBC, IRON, RETICCTPCT in the last 72 hours. Sepsis Labs: No results for input(s): PROCALCITON, LATICACIDVEN in the last 168 hours.  Recent Results (from the past 240 hour(s))  Anaerobic culture     Status: None (Preliminary result)   Collection Time: 06/22/17 11:45 AM  Result Value Ref Range Status   Specimen Description CSF  Final   Special Requests NONE  Final   Culture   Final    NO ANAEROBES ISOLATED; CULTURE IN PROGRESS FOR 5 DAYS   Report Status PENDING  Incomplete  CSF culture     Status: None   Collection Time: 06/22/17 11:45 AM  Result Value Ref Range Status   Specimen Description CSF  Final   Special Requests NONE  Final   Gram Stain   Final    WBC PRESENT, PREDOMINANTLY MONONUCLEAR NO ORGANISMS SEEN CYTOSPIN SMEAR Gram Stain Report Called to,Read Back By and Verified With: Kellyann Ordway.MELTON AT 7408 ON 06/22/17 BY N.THOMPSON    Culture   Final    NO GROWTH 3 DAYS Performed at [facility], [place] [address], [place], [place] [address]    Report Status 06/25/2017 FINAL  Final  Culture, fungus without smear     Status: None (Preliminary result)   Collection Time: 06/22/17 11:45 AM  Result Value Ref Range Status   Specimen Description CSF  Final   Special Requests NONE  Final   Culture   Final    NO FUNGUS ISOLATED AFTER 2 DAYS Performed at [facility], [place] [address], [place], [place] [address]    Report Status PENDING  Incomplete         Radiology Studies: No results found.      Scheduled Meds: . insulin aspart  0-5 Units Subcutaneous  QHS  . insulin aspart  0-9 Units Subcutaneous TID WC   Continuous Infusions: . sodium chloride    . methylPREDNISolone (SOLU-MEDROL) injection Stopped (06/25/17 0540)     LOS: 3 days    Time spent: over 20 minutes    Fayrene Helper, MD [facility] Pager [phone]  If 7PM-7AM, please contact night-coverage [email or web address] Password [email or web address] 06/25/2017, 10:55 AM

## 2017-06-26 ENCOUNTER — Telehealth: Payer: Self-pay | Admitting: Oncology

## 2017-06-26 LAB — GLUCOSE, CAPILLARY
Glucose-Capillary: 219 mg/dL — ABNORMAL HIGH (ref 65–99)
Glucose-Capillary: 166 mg/dL — ABNORMAL HIGH (ref 65–99)
Glucose-Capillary: 140 mg/dL — ABNORMAL HIGH (ref 65–99)
Glucose-Capillary: 204 mg/dL — ABNORMAL HIGH (ref 65–99)

## 2017-06-26 LAB — BASIC METABOLIC PANEL
ANION GAP: 7 (ref 5–15)
BUN: 19 mg/dL (ref 6–20)
CALCIUM: 10 mg/dL (ref 8.9–10.3)
CO2: 23 mmol/L (ref 22–32)
Chloride: 108 mmol/L (ref 101–111)
Creatinine, Ser: 0.72 mg/dL (ref 0.44–1.00)
GFR calc Af Amer: >60 mL/min (ref >60)
GLUCOSE: 132 mg/dL — AB (ref 65–99)
Potassium: 4 mmol/L (ref 3.5–5.1)
Sodium: 138 mmol/L (ref 135–145)

## 2017-06-26 LAB — CBC
HCT: 30.4 % — ABNORMAL LOW (ref 36.0–46.0)
Hemoglobin: 8.2 g/dL — ABNORMAL LOW (ref 12.0–15.0)
MCH: 17.2 pg — ABNORMAL LOW (ref 26.0–34.0)
MCHC: 27 g/dL — ABNORMAL LOW (ref 30.0–36.0)
MCV: 63.7 fL — ABNORMAL LOW (ref 78.0–100.0)
PLATELETS: 1278 10*3/uL — AB (ref 150–400)
RBC: 4.77 MIL/uL (ref 3.87–5.11)
RDW: 29.4 % — AB (ref 11.5–15.5)
WBC: 30.3 10*3/uL — AB (ref 4.0–10.5)

## 2017-06-26 LAB — OLIGOCLONAL BANDS, CSF + SERM

## 2017-06-26 MED ORDER — PREDNISONE 20 MG PO TABS
60.0000 mg | ORAL_TABLET | Freq: Every day | ORAL | Status: DC
  Administered 2017-06-28 – 2017-06-29 (×2): 60 mg via ORAL
  Filled 2017-06-26 (×2): qty 3

## 2017-06-26 NOTE — Telephone Encounter (Signed)
Scheduled appt per 11/16 sch message - patient is aware of apt date and time.

## 2017-06-26 NOTE — Progress Notes (Signed)
Subjective: Eye pain has improved. Able to see shadows now.   Exam: Vitals:   06/25/17 1957 06/26/17 0517  BP: 124/74 134/73  Pulse: 75 (!) 59  Resp: 16 16  Temp: 98.6 F (37 C) 98.4 F (36.9 C)  SpO2: 100% 100%    HEENT-  Normocephalic, no lesions, without obvious abnormality.  Normal external eye and conjunctiva.  Normal TM's bilaterally.  Normal auditory canals and external ears. Normal external nose, mucus membranes and septum.  Normal pharynx. Cardiovascular- S1, S2 normal, pulses palpable throughout   Lungs- chest clear, no wheezing, rales, normal symmetric air entry Abdomen- normal findings: bowel sounds normal Extremities- no edema Lymph-no adenopathy palpable Musculoskeletal-no joint tenderness, deformity or swelling Skin-warm and dry, no hyperpigmentation, vitiligo, or suspicious lesions   Neuro:  CN: right pupil 3 mm and shows APD. Left pupil 2 mm and reactive. Right eye still with lack of full lateral movement and vertical gaze.  Facial sensation is intact to light touch. Face is symmetric at rest with normal strength and mobility. Hearing is intact to conversational voice. Palate elevates symmetrically and uvula is midline. Voice is normal in tone, pitch and quality. Bilateral SCM and trapezii are 5/5. Tongue is midline with normal bulk and mobility.  Motor: Normal bulk, tone, and strength. 5/5 throughout. No drift.  Sensation: Intact to light touch.  DTRs: 2+, symmetric  Toes downgoing bilaterally. No pathologic reflexes.  Coordination: Finger-to-nose and heel-to-shin are without dysmetria   Medications:  Scheduled: . insulin aspart  0-5 Units Subcutaneous QHS  . insulin aspart  0-9 Units Subcutaneous TID WC   Continuous: . sodium chloride    . methylPREDNISolone (SOLU-MEDROL) injection Stopped (06/26/17 8119)    Pertinent Labs/Diagnostics:   Ct Chest W Contrast  Result Date: 06/25/2017 CLINICAL DATA:  Evaluate for lymphadenopathy/possible sarcoidosis.  Loss of vision in the right eye. EXAM: CT CHEST WITH CONTRAST TECHNIQUE: Multidetector CT imaging of the chest was performed during intravenous contrast administration. CONTRAST:  84mL ISOVUE-300 IOPAMIDOL (ISOVUE-300) INJECTION 61% COMPARISON:  Chest x-ray from June 22, 2017 FINDINGS: Cardiovascular: The heart size is borderline. No obvious coronary artery calcifications. The thoracic aorta is normal in caliber with no dissection or atherosclerosis. The main pulmonary artery is normal in caliber with no evidence of pulmonary hypertension. Limited views of the pulmonary arteries more distally demonstrate no pulmonary emboli. Mediastinum/Nodes: No effusions. There is increased soft tissue in the anterior mediastinum. There are multiple smaller nodules superiorly on series 2, image 42. There is a dominant mass on series 2, image 50 measuring 4.4 by 2.5 cm. There appears to be a small amount of thymus located more superiorly and anteriorly on series 2, image 47. No hilar adenopathy. No other mediastinal adenopathy. No adenopathy in the base of neck, or bilateral axilla, or internal mammary chains. Lungs/Pleura: Central airways are normal. No pneumothorax. No findings in the lungs to suggest sarcoidosis. There is a small nodule deep in the left lung base on series 5, image 129 measuring 4 mm. No other nodules, masses, or infiltrates. Upper Abdomen: Hepatic cysts are identified. Visualize liver, gallbladder, and the common bile duct otherwise normal. The kidneys and adrenal glands are normal. Visualized pancreas and spleen are unremarkable. No adenopathy in the upper abdomen. Musculoskeletal: No chest wall abnormality. No acute or significant osseous findings. IMPRESSION: 1. Increased soft tissue in the anterior mediastinum. This is favored to represent adenopathy. The pattern of adenopathy is not typical for sarcoidosis. Lymphoma could have this appearance. A thymic tumor, rather  than adenopathy, is considered less  likely. 2. 4 mm incidental nodule in the inferior left lung base. No follow-up needed if patient is low-risk. Non-contrast chest CT can be considered in 12 months if patient is high-risk. This recommendation follows the consensus statement: Guidelines for Management of Incidental Pulmonary Nodules Detected on CT Images: From the Fleischner Society 2017; Radiology 2017; 284:228-243. 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  Her improvement in pain this quickly with steroids I think supports Tolosa-Hunt syndrome, but the differential is still broad including sarcoidosis, lymphoma.  I think with relatively low CSF WBC infection is much less likely.  Recommendations: 1) complete 3 days of IV Solu-Medrol, 500 mg 3 times daily  2) I would favor continued oral steroid following this, 60 mg daily 3) if she fails to respond, then biopsy still would likely need to be considered, given that it extends into the ethmoid sinus this would likely be the easiest place to biopsy, and could consult ENT for that.      Etta Quill PA-C [facility] [phone] 06/26/2017, 8:39 AM

## 2017-06-26 NOTE — Progress Notes (Signed)
Occupational Therapy Treatment Patient Details Name: Candace Brown MRN: [id number] DOB: [birth year] Today's Date: 06/26/2017    History of present illness Candace Brown is a 46 y.o. female with a history of tobacco use and microcytic anemia who presented to the ED 11/14 due to progressive loss of vision in the right eye. She reports 1 week of gradual blurring of vision in the right eye only with worsening over 2 days prior to arrival associated with right retro-orbital headache and vision totally blacked out. In the ED, exam showed total loss of vision in right eye with diminished pupillary response. MRI orbits and brain demonstrated an enhancing infiltrative soft tissue density involving the right orbital apex and cavernous sinus, causing dural thickening and extending along the right tentorium   OT comments  Do not feel further acute OT needed.  Pt does not have vision in R eye at this time but is able to perform ADL activity safety via compensation using L eye as well as turning head.  Acute OT will sign off as pt is mod I with ADL activity   Follow Up Recommendations  Outpatient OT(IF indicated by MD)    Equipment Recommendations  None recommended by OT       Precautions / Restrictions Precautions Precautions: Fall       Mobility Bed Mobility Overal bed mobility: Modified Independent                Transfers Overall transfer level: Modified independent                        ADL either performed or assessed with clinical judgement   ADL Overall ADL's : Modified independent                                       General ADL Comments: Pt overall mod I  with simple ADL activity . Pt demonstrates ability to compensate for visual loss for R eye vision loss. Pt demonstated safety walking in room and in hall .  Do not feel further OT indicated at this time.       Vision Patient Visual Report: (visual loss R eye.  ) Vision Assessment?: Yes(Pt with  vision loss R eye.  Pt reports able to see shadows at times. Pts R eye does track when L eye moving but not in full visual field. Pt reports she is goiong home tomorrow and is planning to follow up with eye doctor per MD) Eye Alignment: Impaired (comment) Visual Fields: Other (comment)(R eye - complete vision loss)   Perception     Praxis      Cognition Arousal/Alertness: Awake/alert Behavior During Therapy: WFL for tasks assessed/performed Overall Cognitive Status: Within Functional Limits for tasks assessed                                                     Pertinent Vitals/ Pain       Pain Assessment: No/denies pain     Prior Functioning/Environment              Frequency           Progress Toward Goals  OT Goals(current goals can now be found in the care plan section)  Progress towards OT goals: Goals met/education completed, patient discharged from OT(Pt I with simple ADL activity . )     Plan All goals met and education completed, patient discharged from [facility] "6 Clicks" Daily Activity     Outcome Measure   Help from another person eating meals?: None Help from another person taking care of personal grooming?: None Help from another person toileting, which includes using toliet, bedpan, or urinal?: None Help from another person bathing (including washing, rinsing, drying)?: None Help from another person to put on and taking off regular upper body clothing?: None Help from another person to put on and taking off regular lower body clothing?: None 6 Click Score: 24    End of Session        Activity Tolerance Patient tolerated treatment well   Patient Left in chair;with call bell/phone within reach   Nurse Communication Mobility status        Time: 9872-1587 OT Time Calculation (min): 14 min  Charges: OT General Charges $OT Visit: 1 Visit OT Treatments $Self Care/Home Management : 8-22 mins  [place], [place]   Payton Mccallum D 06/26/2017, 3:27 PM

## 2017-06-26 NOTE — Progress Notes (Signed)
I have reviewed the patient's chart including his presenting history and physical exam, and spoken with  Dr. Leonel Ramsay from Neurology as well as the patient's primary team.  The patient presented with sudden onset of severe right-sided retro-orbital pain, ptosis, and ophthalmoplegia.  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  Certainly the differential diagnosis at this point is a little broader to include malignancies such as lymphoma or metastatic disease, or possibly meningioma.  I have reviewed the patient's MRI scan, and in fact have reviewed the case this morning at our multidisciplinary Neuro-Oncology conference.  Consensus opinion was that the patient could be treated with high-dose intravenous steroids for 3-5 days and observe for symptomatic relief, with repeat MRI of the brain and orbit after completion of steroids.  If the patient does not have a significant response symptomatically to steroids, or the MRI scan is unchanged or worsened, certainly at that point biopsy would be reasonable.  I have also carefully reviewed the MRI scan, and I believe that a portion of the orbital lesion does extend into the posterior ethmoid sinuses on the right.  I therefore think that if it does become necessary to biopsy the lesion, it could be approached via an endoscopic endonasal approach by our ENT colleagues.

## 2017-06-26 NOTE — Progress Notes (Signed)
PROGRESS NOTE    Candace Brown  JKD:[id number] DOB: 23-Apr-1971 DOA: 06/21/2017 PCP: Patient, No Pcp Per   Brief Narrative:  Candace Brown K89 y.o.femalewith Candace Brown history of tobacco use and microcytic anemia who presented to the ED 11/14 due to progressive loss of vision in the right eye. She reports 1 week of gradual blurring of vision in the right eye only with worsening over 2 days prior to arrival associated with right retro-orbital headache and vision totally blacked out. In the ED, exam showed total loss of vision in right eye with diminished pupillary response. MRI orbits and brain demonstrated an enhancing infiltrative soft tissue density involving the right orbital apex and cavernous sinus, causing dural thickening and extending along the right tentorium. Neurology was consulted and recommended LP which was performed 11/15. Labs were sent out given broad differential diagnosis. Hemoglobin was noted to be low at 6 with platelets elevated at 1.2 million for which hematology was consulted, IV iron and 1u PRBCs were administered.  Assessment & Plan:   Principal Problem:   Vision loss of right eye Active Problems:   Thrombocytosis (HCC)   Anemia   Right eye vision loss, painful with infiltrative process on MRI: Broad DDx including inflammatory, infectious, and malignant. Heme/onc not suspicious of NHL/BCL. Protein mildly elevated in CSF, otherwise unremarkable, cytology without malignancy, mixed lymphoid cells present. No acute serology is positive '[ ]'$  f/u CSF cx NGTD '[ ]'$  f/u quant gold - negative Elevated CRP, ESR.  Positive ANA (awaiting titers), elevated SSA.   - Appreciate neurology recommendations. Awaiting LP with blood and fluid labs ordered. - If studies are unrevealing, may require neurosurgical involvement for biopsy.   - discussed with neurosurgery yesterday who noted they would wait for final cx prior to bx.  Neurology discussed with neurosurgery as well who noted potentially  may be able to have ENT biopsy given location. - Discussed again today with neurology and neurosurgery.  Neurosurgery rec repeat MRI after 5 days steroids.  If persistent symptoms, recommend ENT consult for biopsy.  '[ ]'$  CT with increased soft tissue in anterior mediastinum "favored to represent adenopathy".  Per radiology report "lymphoma could have this appearance.  Candace Brown thymic tumor, rather than adenopathy, is considered less likely". - solumedrol 500 mg q12 started 11/17-11/20 is last dose, will start prednisone 60 mg daily after this - Tylenol prn pain, will add oxyIR prn breakthrough to limit tylenol intake and avoid lowering sizure threshold with tramadol.  - PT/OT eval. - Quantiferon pending, ANA positive as above, lyme titers negative.  Iron-deficiency anemia: Suspect from menorrhagia. Anemia panel shows severe iron deficiency. No schistocytes on smear. Received IV iron 11/15.  - 1u PRBCs 11/15 with improvement to hgb 7.3 - Appreciate hematology recommendations, will get IV iron at follow up as outpatient with Dr. [name] 11/28 - Telemetry reviewed, stable NSR, ok to DC cardiac monitoring.  Thrombocytosis: Suspected to be reactive to the anemia.  - continue to monitor  Leukocytosis with steroids, continue to monitor  4 mm incidental nodule in inferior L lung base: follow up per radiology recommendations  DVT prophylaxis: SCD Code Status: full  Family Communication: fiance at bedsised Disposition Plan: pending improvement, decision on biospy  Consultants:   Neurology, neurosurgery, heme  Procedures: (Don't include imaging studies which can be auto populated. Include things that cannot be auto populated i.e. Echo, Carotid and venous dopplers, Foley, Bipap, HD, tubes/drains, wound vac, central lines etc)  none  Antimicrobials: (specify start and planned stop date.  Auto populated tables are space occupying and do not give end dates)  none    Subjective: HA improved. Seeing  spots now.  Not completely dark vision.   Objective: Vitals:   06/25/17 1414 06/25/17 1957 06/26/17 0517 06/26/17 1302  BP: 121/69 124/74 134/73 122/69  Pulse: 80 75 (!) 59 76  Resp: '16 16 16 16  '$ Temp: 98.5 F (36.9 C) 98.6 F (37 C) 98.4 F (36.9 C) 98.1 F (36.7 C)  TempSrc: Oral Oral Oral Oral  SpO2: 98% 100% 100% 100%  Weight:      Height:        Intake/Output Summary (Last 24 hours) at 06/26/2017 1404 Last data filed at 06/26/2017 1000 Gross per 24 hour  Intake 282 ml  Output -  Net 282 ml   Filed Weights   06/22/17 0336  Weight: 89.5 kg (197 lb 5 oz)    Examination:  General: No acute distress. Cardiovascular: Heart sounds show [name] regular rate, and rhythm. No gallops or rubs. No murmurs. No JVD. Lungs: Clear to auscultation bilaterally with good air movement. No rales, rhonchi or wheezes. Abdomen: Soft, nontender, nondistended with normal active bowel sounds. No masses. No hepatosplenomegaly. Neurological: Limited pupillary response to light.  Lack of full ROM with vertical and horizontal rom. Skin: Warm and dry. No rashes or lesions. Extremities: No clubbing or cyanosis. No edema. Pedal pulses 2+. Psychiatric: Mood and affect are normal. Insight and judgment are appropriate.  Data Reviewed: I have personally reviewed following labs and imaging studies  CBC: Recent Labs  Lab 06/21/17 2359 06/22/17 0016 06/22/17 0458 06/22/17 1949 06/25/17 0514 06/26/17 0536  WBC 7.1  --  6.2  --  7.1 30.3*  NEUTROABS 4.0  --  3.9  --   --   --   HGB 6.0* 7.5* 6.1* 7.3* 8.0* 8.2*  HCT 22.6* 22.0* 23.2* 25.6* 28.6* 30.4*  MCV 58.2*  --  58.7*  --  61.4* 63.7*  PLT 1,193*  --  1,212*  --  1,246* 8,527*   Basic Metabolic Panel: Recent Labs  Lab 06/22/17 0016 06/22/17 0458 06/25/17 0514 06/26/17 0536  NA 142 138 140 138  K 4.0 3.5 4.0 4.0  CL 109 107 108 108  CO2  --  '25 24 23  '$ GLUCOSE 94 91 154* 132*  BUN '13 10 15 19  '$ CREATININE 0.60 0.71 0.72 0.72  CALCIUM   --  8.9 9.9 10.0  MG  --  1.9  --   --    GFR: Estimated Creatinine Clearance: 94.1 mL/min (by C-G formula based on SCr of 0.72 mg/dL). Liver Function Tests: Recent Labs  Lab 06/22/17 0458  AST 33  ALT 14  ALKPHOS 64  BILITOT 0.5  PROT 8.0  ALBUMIN 3.2*   No results for input(s): LIPASE, AMYLASE in the last 168 hours. No results for input(s): AMMONIA in the last 168 hours. Coagulation Profile: Recent Labs  Lab 06/22/17 0105  INR 1.07   Cardiac Enzymes: No results for input(s): CKTOTAL, CKMB, CKMBINDEX, TROPONINI in the last 168 hours. BNP (last 3 results) No results for input(s): PROBNP in the last 8760 hours. HbA1C: Recent Labs    06/25/17 0514  HGBA1C 5.5   CBG: Recent Labs  Lab 06/25/17 1122 06/25/17 1621 06/25/17 2210 06/26/17 0836 06/26/17 1140  GLUCAP 203* 115* 162* 219* 166*   Lipid Profile: No results for input(s): CHOL, HDL, LDLCALC, TRIG, CHOLHDL, LDLDIRECT in the last 72 hours. Thyroid Function Tests: No results for input(s):  TSH, T4TOTAL, FREET4, T3FREE, THYROIDAB in the last 72 hours. Anemia Panel: No results for input(s): VITAMINB12, FOLATE, FERRITIN, TIBC, IRON, RETICCTPCT in the last 72 hours. Sepsis Labs: No results for input(s): PROCALCITON, LATICACIDVEN in the last 168 hours.  Recent Results (from the past 240 hour(s))  Anaerobic culture     Status: None (Preliminary result)   Collection Time: 06/22/17 11:45 AM  Result Value Ref Range Status   Specimen Description CSF  Final   Special Requests NONE  Final   Culture   Final    NO ANAEROBES ISOLATED; CULTURE IN PROGRESS FOR 5 DAYS   Report Status PENDING  Incomplete  CSF culture     Status: None   Collection Time: 06/22/17 11:45 AM  Result Value Ref Range Status   Specimen Description CSF  Final   Special Requests NONE  Final   Gram Stain   Final    WBC PRESENT, PREDOMINANTLY MONONUCLEAR NO ORGANISMS SEEN CYTOSPIN SMEAR Gram Stain Report Called to,Read Back By and Verified With:  Zela Sobieski.[email or web address] AT 7106 ON 06/22/17 BY N.THOMPSON    Culture   Final    NO GROWTH 3 DAYS Performed at [facility], [place] [address] [place], [place] [address]    Report Status 06/25/2017 FINAL  Final  Culture, fungus without smear     Status: None (Preliminary result)   Collection Time: 06/22/17 11:45 AM  Result Value Ref Range Status   Specimen Description CSF  Final   Special Requests NONE  Final   Culture   Final    NO FUNGUS ISOLATED AFTER 3 DAYS Performed at [facility], [place] [address] [place], [place] [address]    Report Status PENDING  Incomplete         Radiology Studies: Ct Chest W Contrast  Result Date: 06/25/2017 CLINICAL DATA:  Evaluate for lymphadenopathy/possible sarcoidosis. Loss of vision in the right eye. EXAM: CT CHEST WITH CONTRAST TECHNIQUE: Multidetector CT imaging of the chest was performed during intravenous contrast administration. CONTRAST:  75m ISOVUE-300 IOPAMIDOL (ISOVUE-300) INJECTION 61% COMPARISON:  Chest x-ray from June 22, 2017 FINDINGS: Cardiovascular: The heart size is borderline. No obvious coronary artery calcifications. The thoracic aorta is normal in caliber with no dissection or atherosclerosis. The main pulmonary artery is normal in caliber with no evidence of pulmonary hypertension. Limited views of the pulmonary arteries more distally demonstrate no pulmonary emboli. Mediastinum/Nodes: No effusions. There is increased soft tissue in the anterior mediastinum. There are multiple smaller nodules superiorly on series 2, image 42. There is Kaj Vasil dominant mass on series 2, image 50 measuring 4.4 by 2.5 cm. There appears to be Nyair Depaulo small amount of thymus located more superiorly and anteriorly on series 2, image 47. No hilar adenopathy. No other mediastinal adenopathy. No adenopathy in the base of neck, or bilateral axilla, or internal mammary chains. Lungs/Pleura: Central airways are normal. No pneumothorax. No findings in the lungs to suggest  sarcoidosis. There is Juliette Standre small nodule deep in the left lung base on series 5, image 129 measuring 4 mm. No other nodules, masses, or infiltrates. Upper Abdomen: Hepatic cysts are identified. Visualize liver, gallbladder, and the common bile duct otherwise normal. The kidneys and adrenal glands are normal. Visualized pancreas and spleen are unremarkable. No adenopathy in the upper abdomen. Musculoskeletal: No chest wall abnormality. No acute or significant osseous findings. IMPRESSION: 1. Increased soft tissue in the anterior mediastinum. This is favored to represent adenopathy. The pattern of adenopathy is not typical for  sarcoidosis. Lymphoma could have this appearance. Juanpablo Ciresi thymic tumor, rather than adenopathy, is considered less likely. 2. 4 mm incidental nodule in the inferior left lung base. No follow-up needed if patient is low-risk. Non-contrast chest CT can be considered in 12 months if patient is high-risk. This recommendation follows the consensus statement: Guidelines for Management of Incidental Pulmonary Nodules Detected on CT Images: From the Fleischner Society 2017; Radiology 2017; 284:228-243. Electronically Signed   By: Dorise Bullion III M.D   On: 06/25/2017 16:59        Scheduled Meds: . insulin aspart  0-5 Units Subcutaneous QHS  . insulin aspart  0-9 Units Subcutaneous TID WC  . [START ON 06/28/2017] predniSONE  60 mg Oral Q breakfast   Continuous Infusions: . sodium chloride    . methylPREDNISolone (SOLU-MEDROL) injection Stopped (06/26/17 2876)     LOS: 4 days    Time spent: over 20 minutes    Fayrene Helper, MD [facility] Pager [phone]  If 7PM-7AM, please contact night-coverage [email or web address] Password [email or web address] 06/26/2017, 2:04 PM

## 2017-06-27 LAB — CBC
HEMATOCRIT: 28.5 % — AB (ref 36.0–46.0)
Hemoglobin: 7.8 g/dL — ABNORMAL LOW (ref 12.0–15.0)
MCH: 17.8 pg — AB (ref 26.0–34.0)
MCHC: 27.4 g/dL — ABNORMAL LOW (ref 30.0–36.0)
MCV: 64.9 fL — AB (ref 78.0–100.0)
Platelets: 976 10*3/uL (ref 150–400)
RBC: 4.39 MIL/uL (ref 3.87–5.11)
RDW: 29.9 % — ABNORMAL HIGH (ref 11.5–15.5)
WBC: 27.2 10*3/uL — AB (ref 4.0–10.5)

## 2017-06-27 LAB — BASIC METABOLIC PANEL
ANION GAP: 6 (ref 5–15)
BUN: 16 mg/dL (ref 6–20)
CHLORIDE: 110 mmol/L (ref 101–111)
CO2: 25 mmol/L (ref 22–32)
Calcium: 9.7 mg/dL (ref 8.9–10.3)
Creatinine, Ser: 0.66 mg/dL (ref 0.44–1.00)
GFR calc Af Amer: >60 mL/min (ref >60)
GLUCOSE: 155 mg/dL — AB (ref 65–99)
POTASSIUM: 3.9 mmol/L (ref 3.5–5.1)
Sodium: 141 mmol/L (ref 135–145)

## 2017-06-27 LAB — FUNGUS STAIN

## 2017-06-27 LAB — GLUCOSE, CAPILLARY
GLUCOSE-CAPILLARY: 191 mg/dL — AB (ref 65–99)
Glucose-Capillary: 157 mg/dL — ABNORMAL HIGH (ref 65–99)

## 2017-06-27 LAB — FUNGAL STAIN REFLEX

## 2017-06-27 NOTE — Plan of Care (Signed)
  Progressing Health Behavior/Discharge Planning: Ability to manage health-related needs will improve 06/27/2017 2204 - Progressing by Talbert Forest, RN Clinical Measurements: Diagnostic test results will improve 06/27/2017 2204 - Progressing by Talbert Forest, RN Safety: Ability to remain free from injury will improve 06/27/2017 2204 - Progressing by Talbert Forest, RN

## 2017-06-27 NOTE — Progress Notes (Signed)
PT Cancellation Note  Patient Details Name: Candace Brown MRN: [id number] DOB: 04-18-1971   Cancelled Treatment:    Reason Eval/Treat Not Completed: PT screened, no needs identified, will sign off Pt reports she has been mobilizing without difficulty and declines need for acute PT at this time.  PT to sign off.   Chantilly Linskey,KATHrine E 06/27/2017, 10:09 AM Carmelia Bake, PT, DPT 06/27/2017 Pager: [phone]

## 2017-06-27 NOTE — Plan of Care (Signed)
  Progressing Health Behavior/Discharge Planning: Ability to manage health-related needs will improve 06/27/2017 1216 - Progressing by Kerrin Mo, RN Clinical Measurements: Diagnostic test results will improve 06/27/2017 1216 - Progressing by Kerrin Mo, RN Safety: Ability to remain free from injury will improve 06/27/2017 1216 - Progressing by Kerrin Mo, RN

## 2017-06-27 NOTE — Progress Notes (Signed)
PROGRESS NOTE    Candace Brown  ZLD:[id number] DOB: June 04, 1971 DOA: 06/21/2017 PCP: Patient, No Pcp Per   Brief Narrative:  Candace Brown J2 y.o.femalewith a history of tobacco use and microcytic anemia who presented to the ED 11/14 due to progressive loss of vision in the right eye. She reports 1 week of gradual blurring of vision in the right eye only with worsening over 2 days prior to arrival associated with right retro-orbital headache and vision totally blacked out. In the ED, exam showed total loss of vision in right eye with diminished pupillary response. MRI orbits and brain demonstrated an enhancing infiltrative soft tissue density involving the right orbital apex and cavernous sinus, causing dural thickening and extending along the right tentorium. Neurology was consulted and recommended LP which was performed 11/15. Labs were sent out given broad differential diagnosis. Hemoglobin was noted to be low at 6 with platelets elevated at 1.2 million for which hematology was consulted, IV iron and 1u PRBCs were administered.  Assessment & Plan:   Principal Problem:   Vision loss of right eye Active Problems:   Thrombocytosis (HCC)   Anemia   Right eye vision loss, painful with infiltrative process on MRI: Broad DDx including inflammatory, infectious, and malignant. Heme/onc not suspicious of NHL/BCL. Protein mildly elevated in CSF, otherwise unremarkable, cytology without malignancy, mixed lymphoid cells present. No acute serology is positive.  At this point, after discussing with neurology and neurosurgery leading differential is tolosa hunt syndrome (vs lymphoma, en plaque meningioma, metastasis, etc). _0  f/u CSF cx NGTD _1  f/u quant gold - negative Elevated CRP, ESR.  Positive ANA (awaiting titers, not sure why not resulted yet, will repeat antinuclear antibodies IFA), elevated SSA.   - Appreciate neurology recommendations. Awaiting LP with blood and fluid labs ordered.   -  discussed with neurosurgery regarding potential biopsy (see note from 11/19 by Dr. Kathyrn Sheriff) who noted that he recommended 5 days of steroids, followed by repeat MRI scan.  If sx not improved or MRI unchanged, recommending ENT c/s for biopsy. _2  CT with increased soft tissue in anterior mediastinum "favored to represent adenopathy".  Per radiology report "the pattern of adenopathy is not typical for sarcoidosis" "lymphoma could have this appearance.  A thymic tumor, rather than adenopathy, is considered less likely". - solumedrol 500 mg q12 started 11/17-11/20, will start prednisone 60 mg daily on 11/21 _3  Patient will have had 5 days of steroids on 11/22, would repeat MRI at that time and consider bx as noted above - Tylenol prn pain, will add oxyIR prn breakthrough to limit tylenol intake and avoid lowering sizure threshold with tramadol.  - PT/OT eval. - Quantiferon pending, ANA positive as above, lyme titers negative.  Iron-deficiency anemia: Suspect from menorrhagia. Anemia panel shows severe iron deficiency. No schistocytes on smear. Received IV iron 11/15.  - 1u PRBCs 11/15 with improvement to hgb 7.3 - Appreciate hematology recommendations, will get IV iron at follow up as outpatient with Dr. [name] 11/28 - Telemetry reviewed, stable NSR, ok to DC cardiac monitoring.  Thrombocytosis: Suspected to be reactive to the anemia.  - continue to monitor  Leukocytosis with steroids, continue to monitor  4 mm incidental nodule in inferior L lung base: follow up per radiology recommendations  DVT prophylaxis: SCD, ambulation Code Status: full  Family Communication: fiance at bedsised Disposition Plan: pending improvement, decision on biospy  Consultants:   Neurology, neurosurgery, heme  Procedures: (Don't include imaging studies which can be auto  populated. Include things that cannot be auto populated i.e. Echo, Carotid and venous dopplers, Foley, Bipap, HD, tubes/drains, wound vac,  central lines etc)  none  Antimicrobials: (specify start and planned stop date. Auto populated tables are space occupying and do not give end dates)  none    Subjective: Things about the same.  HA improved.   Seeing spots.  Objective: Vitals:   06/26/17 0517 06/26/17 1302 06/26/17 2037 06/27/17 0516  BP: 134/73 122/69 132/86 129/74  Pulse: (!) 59 76 72 60  Resp: _0 Temp: 98.4 F (36.9 C) 98.1 F (36.7 C) 98.5 F (36.9 C) 98.7 F (37.1 C)  TempSrc: Oral Oral Oral Oral  SpO2: 100% 100% 99% 100%  Weight:      Height:        Intake/Output Summary (Last 24 hours) at 06/27/2017 1116 Last data filed at 06/27/2017 0751 Gross per 24 hour  Intake 268 ml  Output -  Net 268 ml   Filed Weights   06/22/17 0336  Weight: 89.5 kg (197 lb 5 oz)    Examination:  General: No acute distress. Cardiovascular: Heart sounds show a regular rate, and rhythm. No gallops or rubs. No murmurs. No JVD. Lungs: Clear to auscultation bilaterally with good air movement. No rales, rhonchi or wheezes. Abdomen: Soft, nontender, nondistended with normal active bowel sounds. No masses. No hepatosplenomegaly. Neurological:  Decreased pupillary response to light on R, EOMI, but limited range Skin: Warm and dry. No rashes or lesions. Extremities: No clubbing or cyanosis. No edema.  Psychiatric: Mood and affect are normal. Insight and judgment are appropriate.  Data Reviewed: I have personally reviewed following labs and imaging studies  CBC: Recent Labs  Lab 06/21/17 2359  06/22/17 0458 06/22/17 1949 06/25/17 0514 06/26/17 0536 06/27/17 0533  WBC 7.1  --  6.2  --  7.1 30.3* 27.2*  NEUTROABS 4.0  --  3.9  --   --   --   --   HGB 6.0*   < > 6.1* 7.3* 8.0* 8.2* 7.8*  HCT 22.6*   < > 23.2* 25.6* 28.6* 30.4* 28.5*  MCV 58.2*  --  58.7*  --  61.4* 63.7* 64.9*  PLT 1,193*  --  1,212*  --  1,246* 1,278* 976*   < > = values in this interval not displayed.   Basic Metabolic Panel: Recent  Labs  Lab 06/22/17 0016 06/22/17 0458 06/25/17 0514 06/26/17 0536 06/27/17 0533  NA 142 138 140 138 141  K 4.0 3.5 4.0 4.0 3.9  CL 109 107 108 108 110  CO2  --  _1 GLUCOSE 94 91 154* 132* 155*  BUN _2 CREATININE 0.60 0.71 0.72 0.72 0.66  CALCIUM  --  8.9 9.9 10.0 9.7  MG  --  1.9  --   --   --    GFR: Estimated Creatinine Clearance: 94.1 mL/min (by C-G formula based on SCr of 0.66 mg/dL). Liver Function Tests: Recent Labs  Lab 06/22/17 0458  AST 33  ALT 14  ALKPHOS 64  BILITOT 0.5  PROT 8.0  ALBUMIN 3.2*   No results for input(s): LIPASE, AMYLASE in the last 168 hours. No results for input(s): AMMONIA in the last 168 hours. Coagulation Profile: Recent Labs  Lab 06/22/17 0105  INR 1.07   Cardiac Enzymes: No results for input(s): CKTOTAL, CKMB, CKMBINDEX, TROPONINI in the last 168 hours. BNP (last 3 results) No results for input(s):  PROBNP in the last 8760 hours. HbA1C: Recent Labs    06/25/17 0514  HGBA1C 5.5   CBG: Recent Labs  Lab 06/26/17 0836 06/26/17 1140 06/26/17 1716 06/26/17 2034 06/27/17 0735  GLUCAP 219* 166* 140* 204* 157*   Lipid Profile: No results for input(s): CHOL, HDL, LDLCALC, TRIG, CHOLHDL, LDLDIRECT in the last 72 hours. Thyroid Function Tests: No results for input(s): TSH, T4TOTAL, FREET4, T3FREE, THYROIDAB in the last 72 hours. Anemia Panel: No results for input(s): VITAMINB12, FOLATE, FERRITIN, TIBC, IRON, RETICCTPCT in the last 72 hours. Sepsis Labs: No results for input(s): PROCALCITON, LATICACIDVEN in the last 168 hours.  Recent Results (from the past 240 hour(s))  Anaerobic culture     Status: None (Preliminary result)   Collection Time: 06/22/17 11:45 AM  Result Value Ref Range Status   Specimen Description CSF  Final   Special Requests NONE  Final   Culture   Final    NO ANAEROBES ISOLATED Performed at [facility], [address] [place], [place] [address]    Report Status PENDING   Incomplete  CSF culture     Status: None   Collection Time: 06/22/17 11:45 AM  Result Value Ref Range Status   Specimen Description CSF  Final   Special Requests NONE  Final   Gram Stain   Final    WBC PRESENT, PREDOMINANTLY MONONUCLEAR NO ORGANISMS SEEN CYTOSPIN SMEAR Gram Stain Report Called to,Read Back By and Verified With: A.MELTON AT 9470 ON 06/22/17 BY N.THOMPSON    Culture   Final    NO GROWTH 3 DAYS Performed at [facility], [place] [address] [place], [place] [address]    Report Status 06/25/2017 FINAL  Final  Culture, fungus without smear     Status: None (Preliminary result)   Collection Time: 06/22/17 11:45 AM  Result Value Ref Range Status   Specimen Description CSF  Final   Special Requests NONE  Final   Culture   Final    NO FUNGUS ISOLATED AFTER 3 DAYS Performed at [facility], [place] [address] [place],  [address]    Report Status PENDING  Incomplete         Radiology Studies: Ct Chest W Contrast  Result Date: 06/25/2017 CLINICAL DATA:  Evaluate for lymphadenopathy/possible sarcoidosis. Loss of vision in the right eye. EXAM: CT CHEST WITH CONTRAST TECHNIQUE: Multidetector CT imaging of the chest was performed during intravenous contrast administration. CONTRAST:  18m ISOVUE-300 IOPAMIDOL (ISOVUE-300) INJECTION 61% COMPARISON:  Chest x-ray from June 22, 2017 FINDINGS: Cardiovascular: The heart size is borderline. No obvious coronary artery calcifications. The thoracic aorta is normal in caliber with no dissection or atherosclerosis. The main pulmonary artery is normal in caliber with no evidence of pulmonary hypertension. Limited views of the pulmonary arteries more distally demonstrate no pulmonary emboli. Mediastinum/Nodes: No effusions. There is increased soft tissue in the anterior mediastinum. There are multiple smaller nodules superiorly on series 2, image 42. There is a dominant mass on series 2, image 50 measuring 4.4 by 2.5 cm. There  appears to be a small amount of thymus located more superiorly and anteriorly on series 2, image 47. No hilar adenopathy. No other mediastinal adenopathy. No adenopathy in the base of neck, or bilateral axilla, or internal mammary chains. Lungs/Pleura: Central airways are normal. No pneumothorax. No findings in the lungs to suggest sarcoidosis. There is a small nodule deep in the left lung base on series 5, image 129 measuring 4 mm.  No other nodules, masses, or infiltrates. Upper Abdomen: Hepatic cysts are identified. Visualize liver, gallbladder, and the common bile duct otherwise normal. The kidneys and adrenal glands are normal. Visualized pancreas and spleen are unremarkable. No adenopathy in the upper abdomen. Musculoskeletal: No chest wall abnormality. No acute or significant osseous findings. IMPRESSION: 1. Increased soft tissue in the anterior mediastinum. This is favored to represent adenopathy. The pattern of adenopathy is not typical for sarcoidosis. Lymphoma could have this appearance. A thymic tumor, rather than adenopathy, is considered less likely. 2. 4 mm incidental nodule in the inferior left lung base. No follow-up needed if patient is low-risk. Non-contrast chest CT can be considered in 12 months if patient is high-risk. This recommendation follows the consensus statement: Guidelines for Management of Incidental Pulmonary Nodules Detected on CT Images: From the Fleischner Society 2017; Radiology 2017; 284:228-243. Electronically Signed   By: Dorise Bullion III M.D   On: 06/25/2017 16:59        Scheduled Meds: . insulin aspart  0-5 Units Subcutaneous QHS  . insulin aspart  0-9 Units Subcutaneous TID WC  . [START ON 06/28/2017] predniSONE  60 mg Oral Q breakfast   Continuous Infusions: . sodium chloride       LOS: 5 days    Time spent: over 20 minutes    Fayrene Helper, MD [facility] Pager [phone]  If 7PM-7AM, please contact  night-coverage [email or web address] Password [email or web address] 06/27/2017, 11:16 AM

## 2017-06-28 ENCOUNTER — Inpatient Hospital Stay (HOSPITAL_COMMUNITY): Payer: No Typology Code available for payment source

## 2017-06-28 LAB — BASIC METABOLIC PANEL
ANION GAP: 5 (ref 5–15)
BUN: 16 mg/dL (ref 6–20)
CHLORIDE: 109 mmol/L (ref 101–111)
CO2: 26 mmol/L (ref 22–32)
Calcium: 9.5 mg/dL (ref 8.9–10.3)
Creatinine, Ser: 0.7 mg/dL (ref 0.44–1.00)
GFR calc non Af Amer: >60 mL/min (ref >60)
Glucose, Bld: 131 mg/dL — ABNORMAL HIGH (ref 65–99)
Potassium: 3.8 mmol/L (ref 3.5–5.1)
Sodium: 140 mmol/L (ref 135–145)

## 2017-06-28 LAB — CBC
HEMATOCRIT: 28.3 % — AB (ref 36.0–46.0)
HEMOGLOBIN: 8 g/dL — AB (ref 12.0–15.0)
MCH: 18.4 pg — ABNORMAL LOW (ref 26.0–34.0)
MCHC: 28.3 g/dL — ABNORMAL LOW (ref 30.0–36.0)
MCV: 65.2 fL — AB (ref 78.0–100.0)
Platelets: 831 10*3/uL — ABNORMAL HIGH (ref 150–400)
RBC: 4.34 MIL/uL (ref 3.87–5.11)
RDW: 31 % — ABNORMAL HIGH (ref 11.5–15.5)
WBC: 24.3 10*3/uL — ABNORMAL HIGH (ref 4.0–10.5)

## 2017-06-28 LAB — GLUCOSE, CAPILLARY
GLUCOSE-CAPILLARY: 110 mg/dL — AB (ref 65–99)
Glucose-Capillary: 176 mg/dL — ABNORMAL HIGH (ref 65–99)
Glucose-Capillary: 157 mg/dL — ABNORMAL HIGH (ref 65–99)
Glucose-Capillary: 150 mg/dL — ABNORMAL HIGH (ref 65–99)
Glucose-Capillary: 171 mg/dL — ABNORMAL HIGH (ref 65–99)
Glucose-Capillary: 130 mg/dL — ABNORMAL HIGH (ref 65–99)

## 2017-06-28 LAB — ANAEROBIC CULTURE

## 2017-06-28 MED ORDER — GADOBENATE DIMEGLUMINE 529 MG/ML IV SOLN
20.0000 mL | Freq: Once | INTRAVENOUS | Status: AC | PRN
  Administered 2017-06-28: 18 mL via INTRAVENOUS

## 2017-06-28 NOTE — Progress Notes (Signed)
PROGRESS NOTE    Candace Brown  IPJ:[id number] DOB: 1970/11/13 DOA: 06/21/2017 PCP: Patient, No Pcp Per    Brief Narrative: Candace Brown B33 y.o.femalewith a history of tobacco use and microcytic anemia who presented to the ED 11/14 due to progressive loss of vision in the right eye. She reports 1 week of gradual blurring of vision in the right eye only with worsening over 2 days prior to arrival associated with right retro-orbital headache and vision totally blacked out. In the ED, exam showedtotal loss of vision in right eye with diminished pupillary response. MRI orbits and brain demonstrated an enhancing infiltrative soft tissue density involving the right orbital apex and cavernous sinus, causing dural thickening and extending along the right tentorium. Neurology was consulted and recommended LP which was performed 11/15. Labs were sent out given broad differential diagnosis. Hemoglobin was noted to be low at 6 with platelets elevated at 1.2 million for which hematology was consulted, IV iron and 1u PRBCs were administered.      Assessment & Plan:   Principal Problem:   Vision loss of right eye Active Problems:   Thrombocytosis (HCC)   Anemia   Right eye vision loss, painful infiltrative process on MRI of the orbits .  She does include inflammatory infectious and malignancy.   Status post LP fluid analysis sent for analysis, cytology without malignancy Lymphoid cells present no acute serologies positive.  After discussing with neurology and neurosurgery differential is  Tolosa Hunt Syndrome, vs lymphoma , meningioma and metastatic lesion.   Elevated ESR and CRP positive ANA elevated SSA.  Albuterol pending, Lyme titers are negative     Discussed with neurosurgery plan for 5 days of steroids followed by a repeat MRI of the orbits.  If symptoms are not improved or if the MRI is unchanged neurosurgery is recommending ENT for biopsy of the mass.  She was started on Solu-Medrol  500 mg every 12 hours from the 17th until 20 November, followed by prednisone 60 mg starting on November 21.  Repeat MRI of the brain and orbits with and without contrast have been ordered.  Pain control.    Iron deficiency anemia suspect from menorrhagia.  Anemia panel shows severe iron deficiency.  Status post 1 unit of packed red blood cell transfusion on the 15th.  Hemoglobin stable around 7.  Hematology consulted recommended giving IV iron.   Thrombocytosis Possibly secondary to to be reactive to the anemia.   Leukocytosis possibly secondary to steroids continue to monitor.   4 mm incidental nodule in the inferior left lung base Per radiology recommendations   DVT prophylaxis: SCDs Code Status: Full code Family Communication: None at bedside disposition Plan:  Pending evaluation Of the infiltrative orbital mass.   Consultants:   Neurosurgery, neurology  Procedures: None  Antimicrobials: None   Subjective: No new complaints, vision in the right eye appears to be improving  Objective: Vitals:   06/27/17 0516 06/27/17 1329 06/27/17 2042 06/28/17 0511  BP: 129/74 136/70 (!) 144/75 132/85  Pulse: 60 73 78 72  Resp: '18 18 18 18  '$ Temp: 98.7 F (37.1 C) 98.7 F (37.1 C) 98.1 F (36.7 C) 100 F (37.8 C)  TempSrc: Oral Oral Oral Oral  SpO2: 100% 100% 100% 100%  Weight:      Height:        Intake/Output Summary (Last 24 hours) at 06/28/2017 1552 Last data filed at 06/28/2017 1000 Gross per 24 hour  Intake 120 ml  Output 100 ml  Net 20 ml   Filed Weights   06/22/17 0336  Weight: 89.5 kg (197 lb 5 oz)    Examination:  General exam: Appears calm and comfortable  Respiratory system: Clear to auscultation. Respiratory effort normal. Cardiovascular system: S1 & S2 heard, RRR. No JVD, murmurs, rubs, gallops or clicks. No pedal edema. Gastrointestinal system: Abdomen is nondistended, soft and nontender. No organomegaly or masses felt. Normal bowel sounds  heard. Central nervous system: Alert and oriented. RIGHT EYE VISION LOSS  Extremities: Symmetric 5 x 5 power. Skin: No rashes, lesions or ulcers Psychiatry: Judgement and insight appear normal. Mood & affect appropriate.     Data Reviewed: I have personally reviewed following labs and imaging studies  CBC: Recent Labs  Lab 06/21/17 2359  06/22/17 0458 06/22/17 1949 06/25/17 0514 06/26/17 0536 06/27/17 0533 06/28/17 0521  WBC 7.1  --  6.2  --  7.1 30.3* 27.2* 24.3*  NEUTROABS 4.0  --  3.9  --   --   --   --   --   HGB 6.0*   < > 6.1* 7.3* 8.0* 8.2* 7.8* 8.0*  HCT 22.6*   < > 23.2* 25.6* 28.6* 30.4* 28.5* 28.3*  MCV 58.2*  --  58.7*  --  61.4* 63.7* 64.9* 65.2*  PLT 1,193*  --  1,212*  --  1,246* 1,278* 976* 831*   < > = values in this interval not displayed.   Basic Metabolic Panel: Recent Labs  Lab 06/22/17 0458 06/25/17 0514 06/26/17 0536 06/27/17 0533 06/28/17 0521  NA 138 140 138 141 140  K 3.5 4.0 4.0 3.9 3.8  CL 107 108 108 110 109  CO2 '25 24 23 25 26  '$ GLUCOSE 91 154* 132* 155* 131*  BUN '10 15 19 16 16  '$ CREATININE 0.71 0.72 0.72 0.66 0.70  CALCIUM 8.9 9.9 10.0 9.7 9.5  MG 1.9  --   --   --   --    GFR: Estimated Creatinine Clearance: 94.1 mL/min (by C-G formula based on SCr of 0.7 mg/dL). Liver Function Tests: Recent Labs  Lab 06/22/17 0458  AST 33  ALT 14  ALKPHOS 64  BILITOT 0.5  PROT 8.0  ALBUMIN 3.2*   No results for input(s): LIPASE, AMYLASE in the last 168 hours. No results for input(s): AMMONIA in the last 168 hours. Coagulation Profile: Recent Labs  Lab 06/22/17 0105  INR 1.07   Cardiac Enzymes: No results for input(s): CKTOTAL, CKMB, CKMBINDEX, TROPONINI in the last 168 hours. BNP (last 3 results) No results for input(s): PROBNP in the last 8760 hours. HbA1C: No results for input(s): HGBA1C in the last 72 hours. CBG: Recent Labs  Lab 06/26/17 2034 06/27/17 0735 06/27/17 2126 06/28/17 0743 06/28/17 1142  GLUCAP 204* 157*  191* 150* 110*   Lipid Profile: No results for input(s): CHOL, HDL, LDLCALC, TRIG, CHOLHDL, LDLDIRECT in the last 72 hours. Thyroid Function Tests: No results for input(s): TSH, T4TOTAL, FREET4, T3FREE, THYROIDAB in the last 72 hours. Anemia Panel: No results for input(s): VITAMINB12, FOLATE, FERRITIN, TIBC, IRON, RETICCTPCT in the last 72 hours. Sepsis Labs: No results for input(s): PROCALCITON, LATICACIDVEN in the last 168 hours.  Recent Results (from the past 240 hour(s))  Anaerobic culture     Status: None   Collection Time: 06/22/17 11:45 AM  Result Value Ref Range Status   Specimen Description CSF  Final   Special Requests NONE  Final   Culture   Final    NO ANAEROBES ISOLATED Performed  at [facility], [place] [address], [place], [place] [address]    Report Status 06/28/2017 FINAL  Final  CSF culture     Status: None   Collection Time: 06/22/17 11:45 AM  Result Value Ref Range Status   Specimen Description CSF  Final   Special Requests NONE  Final   Gram Stain   Final    WBC PRESENT, PREDOMINANTLY MONONUCLEAR NO ORGANISMS SEEN CYTOSPIN SMEAR Gram Stain Report Called to,Read Back By and Verified With: A.MELTON AT 6045 ON 06/22/17 BY N.THOMPSON    Culture   Final    NO GROWTH 3 DAYS Performed at [facility], [place] [address], [place], [place] [address]    Report Status 06/25/2017 FINAL  Final  Culture, fungus without smear     Status: None (Preliminary result)   Collection Time: 06/22/17 11:45 AM  Result Value Ref Range Status   Specimen Description CSF  Final   Special Requests NONE  Final   Culture   Final    NO FUNGUS ISOLATED AFTER 3 DAYS Performed at [facility], [place] [address], [place], [place] [address]    Report Status PENDING  Incomplete  Fungus Stain     Status: None   Collection Time: 06/22/17 11:45 AM  Result Value Ref Range Status   FUNGUS STAIN Final report  Final    Comment: (NOTE) Performed At: [facility] [place], [place] [id number] Rush Farmer MD QM:[id number]    Fungal Source CSF  Final    Comment: Performed at [facility], [place] [address] [place], [place] [address]  Fungal Stain reflex     Status: None   Collection Time: 06/22/17 11:45 AM  Result Value Ref Range Status   Fungal stain result 1 Comment  Final    Comment: (NOTE) KOH/Calcofluor preparation:  no fungus observed. Performed At: [facility] [place], [place] [id number] Rush Farmer MD ZD:[id number]          Radiology Studies: No results found.      Scheduled Meds: . insulin aspart  0-5 Units Subcutaneous QHS  . insulin aspart  0-9 Units Subcutaneous TID WC  . predniSONE  60 mg Oral Q breakfast   Continuous Infusions: . sodium chloride       LOS: 6 days    Time spent: 35 MINUTES.     Hosie Poisson, MD [facility] Pager [phone]   If 7PM-7AM, please contact night-coverage [email or web address] Password [email or web address] 06/28/2017, 3:52 PM

## 2017-06-28 NOTE — Progress Notes (Signed)
Assumed care of this pt at 0000 and agree with previous RN's assessment. Will continue to monitor pt.

## 2017-06-28 NOTE — Care Management Note (Signed)
Case Management Note  Patient Details  Name: Insiya Oshea MRN: [id number] Date of Birth: 1970/11/28  Subjective/Objective:  46 y/o f admitted w/vision loss r eye. From home. Neuro sx following.PT-signed off. OT-otpt OT(not able to stand alone for insurance to pay).                  Action/Plan:d/c plan home.   Expected Discharge Date:                  Expected Discharge Plan:  Home/Self Care  In-House Referral:     Discharge planning Services  CM Consult  Post Acute Care Choice:    Choice offered to:     DME Arranged:    DME Agency:     HH Arranged:    HH Agency:     Status of Service:  In process, will continue to follow  If discussed at Long Length of Stay Meetings, dates discussed:    Additional Comments:  Dessa Phi, RN 06/28/2017, 11:05 AM

## 2017-06-29 DIAGNOSIS — H0589 Other disorders of orbit: Secondary | ICD-10-CM | POA: Insufficient documentation

## 2017-06-29 LAB — GLUCOSE, CAPILLARY
GLUCOSE-CAPILLARY: 208 mg/dL — AB (ref 65–99)
Glucose-Capillary: 90 mg/dL (ref 65–99)
Glucose-Capillary: 134 mg/dL — ABNORMAL HIGH (ref 65–99)
Glucose-Capillary: 170 mg/dL — ABNORMAL HIGH (ref 65–99)

## 2017-06-29 MED ORDER — PREDNISONE 20 MG PO TABS: 60.0000 mg | ORAL_TABLET | Freq: Every day | ORAL | Status: AC

## 2017-06-29 NOTE — Discharge Summary (Signed)
Physician Discharge Summary  Nadira Single YYT:[id number] DOB: 07-04-1971 DOA: 06/21/2017  PCP: Patient, No Pcp Per  Admit date: 06/21/2017 Discharge date: 06/29/2017  Admitted From: Home.  Disposition:  [facility].   Recommendations for Outpatient Follow-up:  1. Follow up with ophthalmology / ENT/ Neuro surgery.     Discharge Condition:guarded.  CODE STATUS:full code.  Diet recommendation: regular diet.   Brief/Interim Summary: Dolores Patty a46 y.o.femalewith a history of tobacco use and microcytic anemia who presented to the ED 11/14 due to progressive loss of vision in the right eye. She reports 1 week of gradual blurring of vision in the right eye only with worsening over 2 days prior to arrival associated with right retro-orbital headache and vision totally blacked out. In the ED, exam showedtotal loss of vision in right eye with diminished pupillary response. MRI orbits and brain demonstrated an enhancing infiltrative soft tissue density involving the right orbital apex and cavernous sinus, causing dural thickening and extending along the right tentorium. Neurology was consulted and recommended LP which was performed 11/15. Labs were sent out given broad differential diagnosis. Hemoglobin was noted to be low at 6 with platelets elevated at 1.2 million for which hematology was consulted, IV iron and 1u PRBCs were administered.   Transfer patient to [facility].     Discharge Diagnoses:  Principal Problem:   Vision loss of right eye Active Problems:   Thrombocytosis (HCC)   Anemia   Right eye vision loss, painful infiltrative process on MRI of the orbits Differential include  Inflammatory vs  Infectious vs malignancy.   Status post LP fluid analysis sent for analysis, cytology without malignancy Lymphoid cells present no acute serologies positive.  After discussing with neurology and neurosurgery differential is  Tolosa Hunt Syndrome, vs lymphoma ,  meningioma and metastatic lesion.   Elevated ESR and CRP positive ANA elevated SSA.  Albuterol pending, Lyme titers are negative     Discussed with neurosurgery plan for 5 days of steroids followed by a repeat MRI of the orbits.  If symptoms are not improved or if the MRI is unchanged neurosurgery is recommending ENT for biopsy of the mass.  She was started on Solu-Medrol 500 mg every 12 hours from the 17th until 20 November, followed by prednisone 60 mg starting on November 21.  Repeat MRI of theorbits with and without contrast have been ordered, shows no change in the size of the mass.  Pain control.   Discussed with ENT, Ophthalmology, recommended transfer to tertiary care.  Transfer to [facility] hospital for biopsy of the mass and removal.    Iron deficiency anemia suspect from menorrhagia.  Anemia panel shows severe iron deficiency.  Status post 1 unit of packed red blood cell transfusion on the 15th.  Hemoglobin stable around 7.  Hematology consulted recommended giving IV iron. Hemoglobin stable around 8.    Thrombocytosis Possibly secondary to to be reactive to the anemia.   Leukocytosis possibly secondary to steroids continue to monitor.   4 mm incidental nodule in the inferior left lung base  Non-contrast chest CT can be considered in 12 months if patient is high-risk. This recommendation follows the consensus statement: Guidelines for Management of Incidental Pulmonary Nodules Detected on CT Images: From the Fleischner Society 2017; Radiology 2017; 284:228-243     Discharge Instructions   Allergies as of 06/29/2017   No Known Allergies     Medication List    TAKE these medications   ferrous sulfate 325 (65 FE)  MG EC tablet Take 325 mg daily with breakfast by mouth.   FIBER PO Take 1 capsule daily by mouth.   MULTIVITAMIN ADULT Tabs Take 1 tablet daily by mouth.   predniSONE 20 MG tablet Commonly known as:  DELTASONE Take 3 tablets (60 mg  total) by mouth daily with breakfast. Start taking on:  06/30/2017       No Known Allergies  Consultations: Ophthalmology over the phone. Dr Noel Journey.  Neuro surgery Dr Lajuana Ripple Neurology Dr Carloyn Jaeger.  Hematology Oncology Dr [name].    Procedures/Studies: Dg Chest 2 View  Result Date: 06/22/2017 CLINICAL DATA:  46 year old female with headache and blurred vision. EXAM: CHEST  2 VIEW COMPARISON:  None. FINDINGS: The lungs are clear. There is no pleural effusion or pneumothorax. Top-normal cardiac size. No acute osseous pathology. IMPRESSION: No active cardiopulmonary disease. Electronically Signed   By: Anner Crete M.D.   On: 06/22/2017 03:25   Ct Chest W Contrast  Result Date: 06/25/2017 CLINICAL DATA:  Evaluate for lymphadenopathy/possible sarcoidosis. Loss of vision in the right eye. EXAM: CT CHEST WITH CONTRAST TECHNIQUE: Multidetector CT imaging of the chest was performed during intravenous contrast administration. CONTRAST:  24m ISOVUE-300 IOPAMIDOL (ISOVUE-300) INJECTION 61% COMPARISON:  Chest x-ray from June 22, 2017 FINDINGS: Cardiovascular: The heart size is borderline. No obvious coronary artery calcifications. The thoracic aorta is normal in caliber with no dissection or atherosclerosis. The main pulmonary artery is normal in caliber with no evidence of pulmonary hypertension. Limited views of the pulmonary arteries more distally demonstrate no pulmonary emboli. Mediastinum/Nodes: No effusions. There is increased soft tissue in the anterior mediastinum. There are multiple smaller nodules superiorly on series 2, image 42. There is a dominant mass on series 2, image 50 measuring 4.4 by 2.5 cm. There appears to be a small amount of thymus located more superiorly and anteriorly on series 2, image 47. No hilar adenopathy. No other mediastinal adenopathy. No adenopathy in the base of neck, or bilateral axilla, or internal mammary chains. Lungs/Pleura: Central airways are  normal. No pneumothorax. No findings in the lungs to suggest sarcoidosis. There is a small nodule deep in the left lung base on series 5, image 129 measuring 4 mm. No other nodules, masses, or infiltrates. Upper Abdomen: Hepatic cysts are identified. Visualize liver, gallbladder, and the common bile duct otherwise normal. The kidneys and adrenal glands are normal. Visualized pancreas and spleen are unremarkable. No adenopathy in the upper abdomen. Musculoskeletal: No chest wall abnormality. No acute or significant osseous findings. IMPRESSION: 1. Increased soft tissue in the anterior mediastinum. This is favored to represent adenopathy. The pattern of adenopathy is not typical for sarcoidosis. Lymphoma could have this appearance. A thymic tumor, rather than adenopathy, is considered less likely. 2. 4 mm incidental nodule in the inferior left lung base. No follow-up needed if patient is low-risk. Non-contrast chest CT can be considered in 12 months if patient is high-risk. This recommendation follows the consensus statement: Guidelines for Management of Incidental Pulmonary Nodules Detected on CT Images: From the Fleischner Society 2017; Radiology 2017; 284:228-243. Electronically Signed   By: DDorise BullionIII M.D   On: 06/25/2017 16:59   Mr Brain W And Wo Contrast  Result Date: 06/22/2017 CLINICAL DATA:  Initial evaluation for acute migraine headache with right-sided visual loss. EXAM: MRI HEAD AND ORBITS WITHOUT AND WITH CONTRAST TECHNIQUE: Multiplanar, multiecho pulse sequences of the brain and surrounding structures were obtained without and with intravenous contrast. Multiplanar, multiecho pulse sequences of the orbits  and surrounding structures were obtained including fat saturation techniques, before and after intravenous contrast administration. CONTRAST:  53m MULTIHANCE GADOBENATE DIMEGLUMINE 529 MG/ML IV SOLN COMPARISON:  None available. FINDINGS: MRI HEAD FINDINGS Brain: Cerebral volume within  normal limits for age. There is abnormal enhancement with soft tissue thickening involving the right cavernous sinus (series 18, image 8). Associated dural thickening and enhancement extends laterally around the right temporal pole in the right middle cranial fossa, and superiorly about the right frontal lobe (series 16, image 20). Extension along the right tentorium posteriorly. Process encases and narrows the cavernous right ICA. Possible involvement of the pituitary gland which is somewhat lobulated in appearance, with prominent enhancement along the pituitary stalk. Abnormal enhancing soft tissue extends into the right orbital apex, better evaluated on corresponding orbital MRI. Additionally, enhancing soft tissue extends inferiorly into the right sphenoid sinus (Series 15, image 13). T2/FLAIR signal abnormality within the right temporal pole compatible with associated vasogenic edema. No associated diffusion abnormality or susceptibility artifact. Appearance of the brain is otherwise within normal limits. No other focal parenchymal signal abnormality. No significant cerebral white matter disease for age. No evidence for acute or subacute infarct. No encephalomalacia to suggest chronic infarction. No other mass lesion or abnormal enhancement. No hydrocephalus. No extra-axial fluid collection. Major dural sinuses grossly patent. Vascular: Cavernous right ICA narrowed and attenuated but retains a patent flow void. Remainder of the intracranial vascular flow voids are widely patent and well preserved. 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 Craniocervical junction otherwise unremarkable. Bone marrow signal intensity somewhat diffusely decreased on T1 weighted sequence, most commonly related to anemia, smoking, or obesity. No scalp soft tissue abnormality. Other: Mastoid air cells are clear. Inner ear structures grossly normal. MRI ORBITS  FINDINGS Orbits: Globes symmetric in size with normal morphology bilaterally. Abnormal somewhat ill-defined avidly enhancing soft tissue density extends into the right orbital apex, most prominent inferiorly at the origins of the right medial and inferior rectus muscles (series 17, image 17). Again, this extends posteriorly into the right cavernous sinus. Enhancing soft tissue partially encases the right optic nerves at the posterior right orbit, extending posteriorly to the optic chiasm. Superior orbital veins remain within normal limits. Lacrimal glands relatively normal. Is spared and relatively normal in appearance. Visualized sinuses: Abnormal enhancement extends from the right orbital apex towards the right sphenoid sinus. Scattered mucosal thickening within the ethmoid air cells. Visualized sinuses otherwise clear. Soft tissues: Periorbital soft tissues within normal limits. IMPRESSION: 1. Abnormal infiltrative soft tissue density and enhancement involving the right orbital apex, extending into the right cavernous sinus, with associated dural thickening and enhancement about the right frontal and temporal lobes, with extension along the right tentorium. Associated edema within the anterior right temporal lobe. Findings are nonspecific, with primary differential considerations including possible meningitis, which may be either infectious or inflammatory in nature, as sarcoidosis or TB meningitis could have this appearance. Idiopathic orbital inflammation (pseudotumor - Tolosa Hunt syndrome) could also be considered. Lymphoma or metastatic disease not entirely excluded. While meningiomas can also involve this region, this is felt to be less likely given the infiltrative nature of this process. Correlation with CSF studies may be helpful for further delineation. 2. 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 3. Otherwise normal brain MRI. Findings were discussed by  telephone at the time of interpretation on 06/22/2017 at 12:55 am with Dr. ARory Percy Electronically Signed   By:  Jeannine Boga M.D.   On: 06/22/2017 00:58   Mr Rosealee Albee IO Contrast  Result Date: 06/28/2017 CLINICAL DATA:  Right orbital apex and middle cranial fossa mass status post 5 days of high dose steroids. EXAM: MRI OF THE ORBITS WITHOUT AND WITH CONTRAST TECHNIQUE: Multiplanar, multisequence MR imaging of the orbits was performed both before and after the administration of intravenous contrast. CONTRAST:  68m MULTIHANCE GADOBENATE DIMEGLUMINE 529 MG/ML IV SOLN COMPARISON:  MRI brain and orbits without and with contrast 06/21/2017. FINDINGS: Extensive enhancement is again seen along the posterior right orbital apex with enhancing mass along the inferior medial aspect of the apex and surrounding the right optic nerve. This has not changed significantly since the prior exam. It measures 10.5 mm in short access just anterior to the apex. There is enhancement extending along the right cavernous sinus and inferiorly along the V3 nerve. Diffuse enhancement along the dura is stable. There is asymmetric enhancement of Meckel's cave on the right in the cavernous sinus. Extensive T2 signal change is present within the subcortical right temporal lobe with some distortion of the cortex inferiorly and medially. The left orbit and globe is within normal limits. Sphenoid sinus opacification is again noted. There is mild mucosal thickening in the posterior ethmoid air cells. Some fluid is present in the right mastoid air cells. Minimal cerebellar tonsillar ectopia is stable without a Chiari malformation. IMPRESSION: 1. Stable enhancing soft tissue in the posterior right orbital apex inferiorly and medially with extension along the right cavernous sinus and the P3 division of the trigeminal nerve. This is most concerning for an infiltrative neoplastic process. This does not have the appearance of meningioma. The  leading differential diagnosis would be lymphoma of versus infiltrative metastatic disease. There is no significant response to the steroids. 2. Marked edema in some encephalomalacia in the right temporal tip. There is some involvement of the inferior medial right temporal lobe which may be neoplastic is well. Question seizures. Electronically Signed   By: CSan MorelleM.D.   On: 06/28/2017 16:16   Mr ORosealee AlbeeWNGContrast  Result Date: 06/22/2017 CLINICAL DATA:  Initial evaluation for acute migraine headache with right-sided visual loss. EXAM: MRI HEAD AND ORBITS WITHOUT AND WITH CONTRAST TECHNIQUE: Multiplanar, multiecho pulse sequences of the brain and surrounding structures were obtained without and with intravenous contrast. Multiplanar, multiecho pulse sequences of the orbits and surrounding structures were obtained including fat saturation techniques, before and after intravenous contrast administration. CONTRAST:  173mMULTIHANCE GADOBENATE DIMEGLUMINE 529 MG/ML IV SOLN COMPARISON:  None available. FINDINGS: MRI HEAD FINDINGS Brain: Cerebral volume within normal limits for age. There is abnormal enhancement with soft tissue thickening involving the right cavernous sinus (series 18, image 8). Associated dural thickening and enhancement extends laterally around the right temporal pole in the right middle cranial fossa, and superiorly about the right frontal lobe (series 16, image 20). Extension along the right tentorium posteriorly. Process encases and narrows the cavernous right ICA. Possible involvement of the pituitary gland which is somewhat lobulated in appearance, with prominent enhancement along the pituitary stalk. Abnormal enhancing soft tissue extends into the right orbital apex, better evaluated on corresponding orbital MRI. Additionally, enhancing soft tissue extends inferiorly into the right sphenoid sinus (Series 15, image 13). T2/FLAIR signal abnormality within the right temporal  pole compatible with associated vasogenic edema. No associated diffusion abnormality or susceptibility artifact. Appearance of the brain is otherwise within normal limits. No other focal parenchymal signal abnormality. No significant  cerebral white matter disease for age. No evidence for acute or subacute infarct. No encephalomalacia to suggest chronic infarction. No other mass lesion or abnormal enhancement. No hydrocephalus. No extra-axial fluid collection. Major dural sinuses grossly patent. Vascular: Cavernous right ICA narrowed and attenuated but retains a patent flow void. Remainder of the intracranial vascular flow voids are widely patent and well preserved. 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 Craniocervical junction otherwise unremarkable. Bone marrow signal intensity somewhat diffusely decreased on T1 weighted sequence, most commonly related to anemia, smoking, or obesity. No scalp soft tissue abnormality. Other: Mastoid air cells are clear. Inner ear structures grossly normal. MRI ORBITS FINDINGS Orbits: Globes symmetric in size with normal morphology bilaterally. Abnormal somewhat ill-defined avidly enhancing soft tissue density extends into the right orbital apex, most prominent inferiorly at the origins of the right medial and inferior rectus muscles (series 17, image 17). Again, this extends posteriorly into the right cavernous sinus. Enhancing soft tissue partially encases the right optic nerves at the posterior right orbit, extending posteriorly to the optic chiasm. Superior orbital veins remain within normal limits. Lacrimal glands relatively normal. Is spared and relatively normal in appearance. Visualized sinuses: Abnormal enhancement extends from the right orbital apex towards the right sphenoid sinus. Scattered mucosal thickening within the ethmoid air cells. Visualized sinuses otherwise clear. Soft tissues:  Periorbital soft tissues within normal limits. IMPRESSION: 1. Abnormal infiltrative soft tissue density and enhancement involving the right orbital apex, extending into the right cavernous sinus, with associated dural thickening and enhancement about the right frontal and temporal lobes, with extension along the right tentorium. Associated edema within the anterior right temporal lobe. Findings are nonspecific, with primary differential considerations including possible meningitis, which may be either infectious or inflammatory in nature, as sarcoidosis or TB meningitis could have this appearance. Idiopathic orbital inflammation (pseudotumor - Tolosa Hunt syndrome) could also be considered. Lymphoma or metastatic disease not entirely excluded. While meningiomas can also involve this region, this is felt to be less likely given the infiltrative nature of this process. Correlation with CSF studies may be helpful for further delineation. 2. 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 3. Otherwise normal brain MRI. Findings were discussed by telephone at the time of interpretation on 06/22/2017 at 12:55 am with Dr. Rory Percy. Electronically Signed   By: Jeannine Boga M.D.   On: 06/22/2017 00:58   Dg Fluoro Guide Lumbar Puncture  Result Date: 06/22/2017 CLINICAL DATA:  Headache and vision abnormality.  Abnormal MRI EXAM: DIAGNOSTIC LUMBAR PUNCTURE UNDER FLUOROSCOPIC GUIDANCE FLUOROSCOPY TIME:  Fluoroscopy Time:  0 minutes 10 seconds Radiation Exposure Index (if provided by the fluoroscopic device): Number of Acquired Spot Images: 0 PROCEDURE: Informed consent was obtained from the patient prior to the procedure, including potential complications of headache, allergy, and pain. With the patient prone, the lower back was prepped with Betadine. 1% Lidocaine was used for local anesthesia. Lumbar puncture was performed at the L3-4 level using a 20 gauge needle with return of  clear CSF with an opening pressure of 24 cm water. Sixteen ml of CSF were obtained for laboratory studies. Closing pressure 13 cm water. The patient tolerated the procedure well and there were no apparent complications. IMPRESSION: Successful lumbar puncture. Electronically Signed   By: Franchot Gallo M.D.   On: 06/22/2017 12:39      Subjective: No new complaints except for vision loss in the right eye.  Discharge Exam: Vitals:   06/28/17 2108 06/29/17 0549  BP: 140/83 (!) 146/85  Pulse: 64 73  Resp: 17 18  Temp: 99.3 F (37.4 C) 99.2 F (37.3 C)  SpO2: 99% 100%   Vitals:   06/28/17 0511 06/28/17 1622 06/28/17 2108 06/29/17 0549  BP: 132/85 124/86 140/83 (!) 146/85  Pulse: 72 83 64 73  Resp: '18 17 17 18  '$ Temp: 100 F (37.8 C) 99.4 F (37.4 C) 99.3 F (37.4 C) 99.2 F (37.3 C)  TempSrc: Oral Oral Oral Oral  SpO2: 100% 100% 99% 100%  Weight:      Height:        General: Pt is alert, awake, not in acute distress Cardiovascular: RRR, S1/S2 +, no rubs, no gallops Respiratory: CTA bilaterally, no wheezing, no rhonchi Abdominal: Soft, NT, ND, bowel sounds + Extremities: no edema, no cyanosis    The results of significant diagnostics from this hospitalization (including imaging, microbiology, ancillary and laboratory) are listed below for reference.     Microbiology: Recent Results (from the past 240 hour(s))  Anaerobic culture     Status: None   Collection Time: 06/22/17 11:45 AM  Result Value Ref Range Status   Specimen Description CSF  Final   Special Requests NONE  Final   Culture   Final    NO ANAEROBES ISOLATED Performed at [facility], [address] [place], [place] [address]    Report Status 06/28/2017 FINAL  Final  CSF culture     Status: None   Collection Time: 06/22/17 11:45 AM  Result Value Ref Range Status   Specimen Description CSF  Final   Special Requests NONE  Final   Gram Stain   Final    WBC PRESENT, PREDOMINANTLY MONONUCLEAR NO  ORGANISMS SEEN CYTOSPIN SMEAR Gram Stain Report Called to,Read Back By and Verified With: A.MELTON AT 1696 ON 06/22/17 BY N.THOMPSON    Culture   Final    NO GROWTH 3 DAYS Performed at [facility], [place] [address], [place], [place] [address]    Report Status 06/25/2017 FINAL  Final  Culture, fungus without smear     Status: None (Preliminary result)   Collection Time: 06/22/17 11:45 AM  Result Value Ref Range Status   Specimen Description CSF  Final   Special Requests NONE  Final   Culture   Final    NO FUNGUS ISOLATED AFTER 3 DAYS Performed at [facility], [place] [address], [place], [place] [address]    Report Status PENDING  Incomplete  Fungus Stain     Status: None   Collection Time: 06/22/17 11:45 AM  Result Value Ref Range Status   FUNGUS STAIN Final report  Final    Comment: (NOTE) Performed At: [facility] [place], [place] [id number] Rush Farmer MD OE:[id number]    Fungal Source CSF  Final    Comment: Performed at [facility], [place] [address], [place], [place] [address]  Fungal Stain reflex     Status: None   Collection Time: 06/22/17 11:45 AM  Result Value Ref Range Status   Fungal stain result 1 Comment  Final    Comment: (NOTE) KOH/Calcofluor preparation:  no fungus observed. Performed At: [facility] [place], [place] [id number] Rush Farmer MD TO:[id number]      Labs: BNP (last 3 results) No results for input(s): BNP in the last 8760 hours. Basic Metabolic Panel: Recent Labs  Lab 06/25/17 [phone]  06/26/17 0536 06/27/17 0533 06/28/17 0521  NA 140 138 141 140  K 4.0 4.0 3.9 3.8  CL 108 108 110 109  CO2 '24 23 25 26  '$ GLUCOSE 154* 132* 155* 131*  BUN '15 19 16 16  '$ CREATININE 0.72 0.72 0.66 0.70  CALCIUM 9.9 10.0 9.7 9.5   Liver Function Tests: No results for input(s): AST, ALT, ALKPHOS, BILITOT, PROT, ALBUMIN in the last 168 hours. No results for input(s): LIPASE, AMYLASE in the last  168 hours. No results for input(s): AMMONIA in the last 168 hours. CBC: Recent Labs  Lab 06/22/17 1949 06/25/17 0514 06/26/17 0536 06/27/17 0533 06/28/17 0521  WBC  --  7.1 30.3* 27.2* 24.3*  HGB 7.3* 8.0* 8.2* 7.8* 8.0*  HCT 25.6* 28.6* 30.4* 28.5* 28.3*  MCV  --  61.4* 63.7* 64.9* 65.2*  PLT  --  1,246* 1,278* 976* 831*   Cardiac Enzymes: No results for input(s): CKTOTAL, CKMB, CKMBINDEX, TROPONINI in the last 168 hours. BNP: Invalid input(s): POCBNP CBG: Recent Labs  Lab 06/28/17 1142 06/28/17 1645 06/28/17 2105 06/29/17 0727 06/29/17 1152  GLUCAP 110* 171* 130* 90 134*   D-Dimer No results for input(s): DDIMER in the last 72 hours. Hgb A1c No results for input(s): HGBA1C in the last 72 hours. Lipid Profile No results for input(s): CHOL, HDL, LDLCALC, TRIG, CHOLHDL, LDLDIRECT in the last 72 hours. Thyroid function studies No results for input(s): TSH, T4TOTAL, T3FREE, THYROIDAB in the last 72 hours.  Invalid input(s): FREET3 Anemia work up No results for input(s): VITAMINB12, FOLATE, FERRITIN, TIBC, IRON, RETICCTPCT in the last 72 hours. Urinalysis No results found for: COLORURINE, APPEARANCEUR, [place], [place], [place], [place], [place], [place], PROTEINUR, UROBILINOGEN, NITRITE, LEUKOCYTESUR Sepsis Labs Invalid input(s): PROCALCITONIN,  WBC,  LACTICIDVEN Microbiology Recent Results (from the past 240 hour(s))  Anaerobic culture     Status: None   Collection Time: 06/22/17 11:45 AM  Result Value Ref Range Status   Specimen Description CSF  Final   Special Requests NONE  Final   Culture   Final    NO ANAEROBES ISOLATED Performed at [facility], [address] [place], [place] [address]    Report Status 06/28/2017 FINAL  Final  CSF culture     Status: None   Collection Time: 06/22/17 11:45 AM  Result Value Ref Range Status   Specimen Description CSF  Final   Special Requests NONE  Final   Gram Stain   Final    WBC PRESENT, PREDOMINANTLY  MONONUCLEAR NO ORGANISMS SEEN CYTOSPIN SMEAR Gram Stain Report Called to,Read Back By and Verified With: A.MELTON AT 9675 ON 06/22/17 BY N.THOMPSON    Culture   Final    NO GROWTH 3 DAYS Performed at [facility], [place] [address] [place], [place] [address]    Report Status 06/25/2017 FINAL  Final  Culture, fungus without smear     Status: None (Preliminary result)   Collection Time: 06/22/17 11:45 AM  Result Value Ref Range Status   Specimen Description CSF  Final   Special Requests NONE  Final   Culture   Final    NO FUNGUS ISOLATED AFTER 3 DAYS Performed at [facility], [place] [address] [place], [place] [address]    Report Status PENDING  Incomplete  Fungus Stain     Status: None   Collection Time: 06/22/17 11:45 AM  Result Value Ref Range Status   FUNGUS STAIN Final report  Final    Comment: (NOTE) Performed  At: [facility] [place], [place] [id number] Rush Farmer MD ME:[id number]    Fungal Source CSF  Final    Comment: Performed at [facility], [place] [address], [place], [place] [address]  Fungal Stain reflex     Status: None   Collection Time: 06/22/17 11:45 AM  Result Value Ref Range Status   Fungal stain result 1 Comment  Final    Comment: (NOTE) KOH/Calcofluor preparation:  no fungus observed. Performed At: [facility] [place], [place] [id number] Rush Farmer MD PF:[id number]      Time coordinating discharge: Over 30 minutes  SIGNED:   Hosie Poisson, MD  [facility] 06/29/2017, 2:53 PM Pager   If 7PM-7AM, please contact night-coverage [email or web address] Password [email or web address]

## 2017-06-29 NOTE — Progress Notes (Signed)
Report called to Conrad, RN at [facility]. Patient is stable for transport. Transport arranged. Sent with copy of the full transfer report, CD of radiology images, and EMTALA, and Medical Nessesity form.

## 2017-06-30 LAB — ANTINUCLEAR ANTIBODIES, IFA: ANA Ab, IFA: NEGATIVE

## 2017-06-30 NOTE — Progress Notes (Signed)
Per [facility] transport, patient will not be transported until day shift at this time. Patient updated and RN Maricel at [facility] also update on status.

## 2017-06-30 NOTE — Progress Notes (Signed)
Patient discharged and transported to [facility] at this time via [facility] transport. Husband at bedside. All belongings sent with patient/husband as well as necessary paperwork. [facility] RN Maricel made aware that patient is in transport.

## 2017-07-01 DIAGNOSIS — D509 Iron deficiency anemia, unspecified: Secondary | ICD-10-CM | POA: Insufficient documentation

## 2017-07-01 DIAGNOSIS — G43B1 Ophthalmoplegic migraine, intractable: Secondary | ICD-10-CM | POA: Insufficient documentation

## 2017-07-05 ENCOUNTER — Ambulatory Visit: Payer: PRIVATE HEALTH INSURANCE

## 2017-07-05 ENCOUNTER — Other Ambulatory Visit: Payer: PRIVATE HEALTH INSURANCE

## 2017-07-05 ENCOUNTER — Ambulatory Visit: Payer: PRIVATE HEALTH INSURANCE | Admitting: Adult Health

## 2017-07-05 DIAGNOSIS — J9859 Other diseases of mediastinum, not elsewhere classified: Secondary | ICD-10-CM | POA: Insufficient documentation

## 2017-07-05 NOTE — Progress Notes (Deleted)
[place]  [phone] [phone]) [phone]     ID: Candace Brown DOB: 04-02-1971  MR#: [id number]  DEY#:[id number]  Patient Care Team: Patient, No Pcp Per as PCP - General ([place]) Scot Dock, NP OTHER MD:  CHIEF COMPLAINT:   CURRENT TREATMENT:    HISTORY OF CURRENT ILLNESS:   The patient's subsequent history is as detailed below.  INTERVAL HISTORY: Her case was also presented at the multidisciplinary breast cancer conference on the same day. At that time a preliminary plan was proposed:   REVIEW OF SYSTEMS:  PAST MEDICAL HISTORY: Past Medical History:  Diagnosis Date  . Anemia     PAST SURGICAL HISTORY: No past surgical history on file.  FAMILY HISTORY Family History  Problem Relation Age of Onset  . Diabetes Mellitus II Neg Hx   . Cancer Neg Hx     GYNECOLOGIC HISTORY:  Patient's last menstrual period was 06/19/2017.   SOCIAL HISTORY:      ADVANCED DIRECTIVES:    HEALTH MAINTENANCE: Social History   Tobacco Use  . Smoking status: Current Every Day Smoker  . Smokeless tobacco: Never Used  Substance Use Topics  . Alcohol use: No    Frequency: Never  . Drug use: No     Colonoscopy:  PAP:  Bone density:   No Known Allergies  Current Outpatient Medications  Medication Sig Dispense Refill  . ferrous sulfate 325 (65 FE) MG EC tablet Take 325 mg daily with breakfast by mouth.    . FIBER PO Take 1 capsule daily by mouth.    . Multiple Vitamins-Minerals (MULTIVITAMIN ADULT) TABS Take 1 tablet daily by mouth.    . predniSONE (DELTASONE) 20 MG tablet Take 3 tablets (60 mg total) by mouth daily with breakfast.     No current facility-administered medications for this visit.     OBJECTIVE:  There were no vitals filed for this visit.   There is no height or weight on file to calculate BMI.   Wt Readings from Last 3 Encounters:  06/22/17 197 lb 5 oz (89.5 kg)      ECOG FS:{CHL ONC  [id number]}  Ocular: Sclerae unicteric, pupils round and equal Ear-nose-throat: Oropharynx clear and moist Lymphatic: No cervical or supraclavicular adenopathy Lungs no rales or rhonchi Heart regular rate and rhythm Abd soft, nontender, positive bowel sounds MSK no focal spinal tenderness, no joint edema Neuro: non-focal, well-oriented, appropriate affect Breasts:    LAB RESULTS:  CMP     Component Value Date/Time   NA 140 06/28/2017 0521   K 3.8 06/28/2017 0521   CL 109 06/28/2017 0521   CO2 26 06/28/2017 0521   GLUCOSE 131 (H) 06/28/2017 0521   BUN 16 06/28/2017 0521   CREATININE 0.70 06/28/2017 0521   CALCIUM 9.5 06/28/2017 0521   PROT 8.0 06/22/2017 0458   ALBUMIN 3.2 (L) 06/22/2017 0458   AST 33 06/22/2017 0458   ALT 14 06/22/2017 0458   ALKPHOS 64 06/22/2017 0458   BILITOT 0.5 06/22/2017 0458   GFRNONAA >60 06/28/2017 0521   GFRAA >60 06/28/2017 0521    No results found for: TOTALPROTELP, ALBUMINELP, A1GS, A2GS, BETS, BETA2SER, GAMS, MSPIKE, SPEI  No results found for: KPAFRELGTCHN, LAMBDASER, KAPLAMBRATIO  Lab Results  Component Value Date   WBC 24.3 (H) 06/28/2017   NEUTROABS 3.9 06/22/2017   HGB 8.0 (L) 06/28/2017   HCT 28.3 (L) 06/28/2017   MCV 65.2 (L) 06/28/2017   PLT 831 (H) 06/28/2017  Chemistry      Component Value Date/Time   NA 140 06/28/2017 0521   K 3.8 06/28/2017 0521   CL 109 06/28/2017 0521   CO2 26 06/28/2017 0521   BUN 16 06/28/2017 0521   CREATININE 0.70 06/28/2017 0521      Component Value Date/Time   CALCIUM 9.5 06/28/2017 0521   ALKPHOS 64 06/22/2017 0458   AST 33 06/22/2017 0458   ALT 14 06/22/2017 0458   BILITOT 0.5 06/22/2017 0458       No results found for: LABCA2  No components found for: CZYSAY301  No results for input(s): INR in the last 168 hours.  No results found for: LABCA2  No results found for: SWF093  No results found for: ATF573  No results found for: UKG254  No results found for:  CA2729  No components found for: HGQUANT  No results found for: CEA1 / No results found for: CEA1   No results found for: AFPTUMOR  No results found for: CHROMOGRNA  No results found for: PSA1  No visits with results within 3 Day(s) from this visit.  Latest known visit with results is:  Admission on 06/21/2017, Discharged on 06/30/2017  No results displayed because visit has over 200 results.      (this displays the last labs from the last 3 days)  No results found for: TOTALPROTELP, ALBUMINELP, A1GS, A2GS, BETS, BETA2SER, GAMS, MSPIKE, SPEI (this displays SPEP labs)  No results found for: KPAFRELGTCHN, LAMBDASER, KAPLAMBRATIO (kappa/lambda light chains)  No results found for: HGBA, HGBA2QUANT, HGBFQUANT, HGBSQUAN (Hemoglobinopathy evaluation)   No results found for: LDH  Lab Results  Component Value Date   IRON 5 (L) 06/22/2017   TIBC 325 06/22/2017   IRONPCTSAT 2 (L) 06/22/2017   (Iron and TIBC)  Lab Results  Component Value Date   FERRITIN 4 (L) 06/22/2017    Urinalysis No results found for: COLORURINE, APPEARANCEUR, LABSPEC, PHURINE, GLUCOSEU, HGBUR, BILIRUBINUR, KETONESUR, PROTEINUR, UROBILINOGEN, NITRITE, LEUKOCYTESUR   STUDIES: Dg Chest 2 View  Result Date: 06/22/2017 CLINICAL DATA:  46 year old female with headache and blurred vision. EXAM: CHEST  2 VIEW COMPARISON:  None. FINDINGS: The lungs are clear. There is no pleural effusion or pneumothorax. Top-normal cardiac size. No acute osseous pathology. IMPRESSION: No active cardiopulmonary disease. Electronically Signed   By: Anner Crete M.D.   On: 06/22/2017 03:25   Ct Chest W Contrast  Result Date: 06/25/2017 CLINICAL DATA:  Evaluate for lymphadenopathy/possible sarcoidosis. Loss of vision in the right eye. EXAM: CT CHEST WITH CONTRAST TECHNIQUE: Multidetector CT imaging of the chest was performed during intravenous contrast administration. CONTRAST:  68mL ISOVUE-300 IOPAMIDOL (ISOVUE-300)  INJECTION 61% COMPARISON:  Chest x-ray from June 22, 2017 FINDINGS: Cardiovascular: The heart size is borderline. No obvious coronary artery calcifications. The thoracic aorta is normal in caliber with no dissection or atherosclerosis. The main pulmonary artery is normal in caliber with no evidence of pulmonary hypertension. Limited views of the pulmonary arteries more distally demonstrate no pulmonary emboli. Mediastinum/Nodes: No effusions. There is increased soft tissue in the anterior mediastinum. There are multiple smaller nodules superiorly on series 2, image 42. There is a dominant mass on series 2, image 50 measuring 4.4 by 2.5 cm. There appears to be a small amount of thymus located more superiorly and anteriorly on series 2, image 47. No hilar adenopathy. No other mediastinal adenopathy. No adenopathy in the base of neck, or bilateral axilla, or internal mammary chains. Lungs/Pleura: Central airways are normal. No pneumothorax. No  findings in the lungs to suggest sarcoidosis. There is a small nodule deep in the left lung base on series 5, image 129 measuring 4 mm. No other nodules, masses, or infiltrates. Upper Abdomen: Hepatic cysts are identified. Visualize liver, gallbladder, and the common bile duct otherwise normal. The kidneys and adrenal glands are normal. Visualized pancreas and spleen are unremarkable. No adenopathy in the upper abdomen. Musculoskeletal: No chest wall abnormality. No acute or significant osseous findings. IMPRESSION: 1. Increased soft tissue in the anterior mediastinum. This is favored to represent adenopathy. The pattern of adenopathy is not typical for sarcoidosis. Lymphoma could have this appearance. A thymic tumor, rather than adenopathy, is considered less likely. 2. 4 mm incidental nodule in the inferior left lung base. No follow-up needed if patient is low-risk. Non-contrast chest CT can be considered in 12 months if patient is high-risk. This recommendation follows the  consensus statement: Guidelines for Management of Incidental Pulmonary Nodules Detected on CT Images: From the Fleischner Society 2017; Radiology 2017; 284:228-243. Electronically Signed   By: Dorise Bullion III M.D   On: 06/25/2017 16:59   Mr Brain W And Wo Contrast  Result Date: 06/22/2017 CLINICAL DATA:  Initial evaluation for acute migraine headache with right-sided visual loss. EXAM: MRI HEAD AND ORBITS WITHOUT AND WITH CONTRAST TECHNIQUE: Multiplanar, multiecho pulse sequences of the brain and surrounding structures were obtained without and with intravenous contrast. Multiplanar, multiecho pulse sequences of the orbits and surrounding structures were obtained including fat saturation techniques, before and after intravenous contrast administration. CONTRAST:  56mL MULTIHANCE GADOBENATE DIMEGLUMINE 529 MG/ML IV SOLN COMPARISON:  None available. FINDINGS: MRI HEAD FINDINGS Brain: Cerebral volume within normal limits for age. There is abnormal enhancement with soft tissue thickening involving the right cavernous sinus (series 18, image 8). Associated dural thickening and enhancement extends laterally around the right temporal pole in the right middle cranial fossa, and superiorly about the right frontal lobe (series 16, image 20). Extension along the right tentorium posteriorly. Process encases and narrows the cavernous right ICA. Possible involvement of the pituitary gland which is somewhat lobulated in appearance, with prominent enhancement along the pituitary stalk. Abnormal enhancing soft tissue extends into the right orbital apex, better evaluated on corresponding orbital MRI. Additionally, enhancing soft tissue extends inferiorly into the right sphenoid sinus (Series 15, image 13). T2/FLAIR signal abnormality within the right temporal pole compatible with associated vasogenic edema. No associated diffusion abnormality or susceptibility artifact. Appearance of the brain is otherwise within normal  limits. No other focal parenchymal signal abnormality. No significant cerebral white matter disease for age. No evidence for acute or subacute infarct. No encephalomalacia to suggest chronic infarction. No other mass lesion or abnormal enhancement. No hydrocephalus. No extra-axial fluid collection. Major dural sinuses grossly patent. Vascular: Cavernous right ICA narrowed and attenuated but retains a patent flow void. Remainder of the intracranial vascular flow voids are widely patent and well preserved. 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 Craniocervical junction otherwise unremarkable. Bone marrow signal intensity somewhat diffusely decreased on T1 weighted sequence, most commonly related to anemia, smoking, or obesity. No scalp soft tissue abnormality. Other: Mastoid air cells are clear. Inner ear structures grossly normal. MRI ORBITS FINDINGS Orbits: Globes symmetric in size with normal morphology bilaterally. Abnormal somewhat ill-defined avidly enhancing soft tissue density extends into the right orbital apex, most prominent inferiorly at the origins of the right medial and inferior rectus muscles (series 17, image 17). Again,  this extends posteriorly into the right cavernous sinus. Enhancing soft tissue partially encases the right optic nerves at the posterior right orbit, extending posteriorly to the optic chiasm. Superior orbital veins remain within normal limits. Lacrimal glands relatively normal. Is spared and relatively normal in appearance. Visualized sinuses: Abnormal enhancement extends from the right orbital apex towards the right sphenoid sinus. Scattered mucosal thickening within the ethmoid air cells. Visualized sinuses otherwise clear. Soft tissues: Periorbital soft tissues within normal limits. IMPRESSION: 1. Abnormal infiltrative soft tissue density and enhancement involving the right orbital apex, extending into  the right cavernous sinus, with associated dural thickening and enhancement about the right frontal and temporal lobes, with extension along the right tentorium. Associated edema within the anterior right temporal lobe. Findings are nonspecific, with primary differential considerations including possible meningitis, which may be either infectious or inflammatory in nature, as sarcoidosis or TB meningitis could have this appearance. Idiopathic orbital inflammation (pseudotumor - Tolosa Hunt syndrome) could also be considered. Lymphoma or metastatic disease not entirely excluded. While meningiomas can also involve this region, this is felt to be less likely given the infiltrative nature of this process. Correlation with CSF studies may be helpful for further delineation. 2. 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 3. Otherwise normal brain MRI. Findings were discussed by telephone at the time of interpretation on 06/22/2017 at 12:55 am with Dr. Rory Percy. Electronically Signed   By: Jeannine Boga M.D.   On: 06/22/2017 00:58   Mr Rosealee Albee ZD Contrast  Result Date: 06/28/2017 CLINICAL DATA:  Right orbital apex and middle cranial fossa mass status post 5 days of high dose steroids. EXAM: MRI OF THE ORBITS WITHOUT AND WITH CONTRAST TECHNIQUE: Multiplanar, multisequence MR imaging of the orbits was performed both before and after the administration of intravenous contrast. CONTRAST:  55mL MULTIHANCE GADOBENATE DIMEGLUMINE 529 MG/ML IV SOLN COMPARISON:  MRI brain and orbits without and with contrast 06/21/2017. FINDINGS: Extensive enhancement is again seen along the posterior right orbital apex with enhancing mass along the inferior medial aspect of the apex and surrounding the right optic nerve. This has not changed significantly since the prior exam. It measures 10.5 mm in short access just anterior to the apex. There is enhancement extending along the right cavernous  sinus and inferiorly along the V3 nerve. Diffuse enhancement along the dura is stable. There is asymmetric enhancement of Meckel's cave on the right in the cavernous sinus. Extensive T2 signal change is present within the subcortical right temporal lobe with some distortion of the cortex inferiorly and medially. The left orbit and globe is within normal limits. Sphenoid sinus opacification is again noted. There is mild mucosal thickening in the posterior ethmoid air cells. Some fluid is present in the right mastoid air cells. Minimal cerebellar tonsillar ectopia is stable without a Chiari malformation. IMPRESSION: 1. Stable enhancing soft tissue in the posterior right orbital apex inferiorly and medially with extension along the right cavernous sinus and the P3 division of the trigeminal nerve. This is most concerning for an infiltrative neoplastic process. This does not have the appearance of meningioma. The leading differential diagnosis would be lymphoma of versus infiltrative metastatic disease. There is no significant response to the steroids. 2. Marked edema in some encephalomalacia in the right temporal tip. There is some involvement of the inferior medial right temporal lobe which may be neoplastic is well. Question seizures. Electronically Signed   By: San Morelle M.D.   On: 06/28/2017 16:16  Mr Rosealee Albee Wo Contrast  Result Date: 06/22/2017 CLINICAL DATA:  Initial evaluation for acute migraine headache with right-sided visual loss. EXAM: MRI HEAD AND ORBITS WITHOUT AND WITH CONTRAST TECHNIQUE: Multiplanar, multiecho pulse sequences of the brain and surrounding structures were obtained without and with intravenous contrast. Multiplanar, multiecho pulse sequences of the orbits and surrounding structures were obtained including fat saturation techniques, before and after intravenous contrast administration. CONTRAST:  9mL MULTIHANCE GADOBENATE DIMEGLUMINE 529 MG/ML IV SOLN COMPARISON:  None  available. FINDINGS: MRI HEAD FINDINGS Brain: Cerebral volume within normal limits for age. There is abnormal enhancement with soft tissue thickening involving the right cavernous sinus (series 18, image 8). Associated dural thickening and enhancement extends laterally around the right temporal pole in the right middle cranial fossa, and superiorly about the right frontal lobe (series 16, image 20). Extension along the right tentorium posteriorly. Process encases and narrows the cavernous right ICA. Possible involvement of the pituitary gland which is somewhat lobulated in appearance, with prominent enhancement along the pituitary stalk. Abnormal enhancing soft tissue extends into the right orbital apex, better evaluated on corresponding orbital MRI. Additionally, enhancing soft tissue extends inferiorly into the right sphenoid sinus (Series 15, image 13). T2/FLAIR signal abnormality within the right temporal pole compatible with associated vasogenic edema. No associated diffusion abnormality or susceptibility artifact. Appearance of the brain is otherwise within normal limits. No other focal parenchymal signal abnormality. No significant cerebral white matter disease for age. No evidence for acute or subacute infarct. No encephalomalacia to suggest chronic infarction. No other mass lesion or abnormal enhancement. No hydrocephalus. No extra-axial fluid collection. Major dural sinuses grossly patent. Vascular: Cavernous right ICA narrowed and attenuated but retains a patent flow void. Remainder of the intracranial vascular flow voids are widely patent and well preserved. 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 Craniocervical junction otherwise unremarkable. Bone marrow signal intensity somewhat diffusely decreased on T1 weighted sequence, most commonly related to anemia, smoking, or obesity. No scalp soft tissue abnormality. Other: Mastoid  air cells are clear. Inner ear structures grossly normal. MRI ORBITS FINDINGS Orbits: Globes symmetric in size with normal morphology bilaterally. Abnormal somewhat ill-defined avidly enhancing soft tissue density extends into the right orbital apex, most prominent inferiorly at the origins of the right medial and inferior rectus muscles (series 17, image 17). Again, this extends posteriorly into the right cavernous sinus. Enhancing soft tissue partially encases the right optic nerves at the posterior right orbit, extending posteriorly to the optic chiasm. Superior orbital veins remain within normal limits. Lacrimal glands relatively normal. Is spared and relatively normal in appearance. Visualized sinuses: Abnormal enhancement extends from the right orbital apex towards the right sphenoid sinus. Scattered mucosal thickening within the ethmoid air cells. Visualized sinuses otherwise clear. Soft tissues: Periorbital soft tissues within normal limits. IMPRESSION: 1. Abnormal infiltrative soft tissue density and enhancement involving the right orbital apex, extending into the right cavernous sinus, with associated dural thickening and enhancement about the right frontal and temporal lobes, with extension along the right tentorium. Associated edema within the anterior right temporal lobe. Findings are nonspecific, with primary differential considerations including possible meningitis, which may be either infectious or inflammatory in nature, as sarcoidosis or TB meningitis could have this appearance. Idiopathic orbital inflammation (pseudotumor - Tolosa Hunt syndrome) could also be considered. Lymphoma or metastatic disease not entirely excluded. While meningiomas can also involve this region, this is felt to be less likely given the  infiltrative nature of this process. Correlation with CSF studies may be helpful for further delineation. 2. 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 3. Otherwise normal brain MRI. Findings were discussed by telephone at the time of interpretation on 06/22/2017 at 12:55 am with Dr. Rory Percy. Electronically Signed   By: Jeannine Boga M.D.   On: 06/22/2017 00:58   Dg Fluoro Guide Lumbar Puncture  Result Date: 06/22/2017 CLINICAL DATA:  Headache and vision abnormality.  Abnormal MRI EXAM: DIAGNOSTIC LUMBAR PUNCTURE UNDER FLUOROSCOPIC GUIDANCE FLUOROSCOPY TIME:  Fluoroscopy Time:  0 minutes 10 seconds Radiation Exposure Index (if provided by the fluoroscopic device): Number of Acquired Spot Images: 0 PROCEDURE: Informed consent was obtained from the patient prior to the procedure, including potential complications of headache, allergy, and pain. With the patient prone, the lower back was prepped with Betadine. 1% Lidocaine was used for local anesthesia. Lumbar puncture was performed at the L3-4 level using a 20 gauge needle with return of clear CSF with an opening pressure of 24 cm water. Sixteen ml of CSF were obtained for laboratory studies. Closing pressure 13 cm water. The patient tolerated the procedure well and there were no apparent complications. IMPRESSION: Successful lumbar puncture. Electronically Signed   By: Franchot Gallo M.D.   On: 06/22/2017 12:39    ELIGIBLE FOR AVAILABLE RESEARCH PROTOCOL: ***  ASSESSMENT: 46 y.o.   PLAN: We spent the better part of today's hour-long appointment discussing the biology of her diagnosis and the specifics of her situation    Candace Brown has a good understanding of the overall plan. She agrees with it. She knows the goal of treatment in her case is cure. She will call with any problems that may develop before her next visit here.  Scot Dock, NP   07/05/2017 6:53 AM Medical Oncology and Hematology [facility] [address] [place], [place] [address] Tel. [phone]    Fax. [phone]

## 2017-07-07 MED ORDER — TRAMADOL HCL 50 MG PO TABS
50.00 mg | ORAL_TABLET | ORAL | Status: DC
Start: ? — End: 2017-07-07

## 2017-07-07 MED ORDER — PREDNISONE 50 MG PO TABS
50.00 mg | ORAL_TABLET | ORAL | Status: DC
Start: 2017-07-06 — End: 2017-07-07

## 2017-07-07 MED ORDER — GENERIC EXTERNAL MEDICATION
1.00 | Status: DC
Start: 2017-07-05 — End: 2017-07-07

## 2017-07-07 MED ORDER — DOCUSATE SODIUM 100 MG PO CAPS
100.00 mg | ORAL_CAPSULE | ORAL | Status: DC
Start: ? — End: 2017-07-07

## 2017-07-07 MED ORDER — ACETAMINOPHEN 325 MG PO TABS
650.00 mg | ORAL_TABLET | ORAL | Status: DC
Start: ? — End: 2017-07-07

## 2017-07-07 MED ORDER — OXYCODONE-ACETAMINOPHEN 5-325 MG PO TABS
2.00 | ORAL_TABLET | ORAL | Status: DC
Start: ? — End: 2017-07-07

## 2017-07-07 MED ORDER — ONDANSETRON 4 MG PO TBDP
4.00 mg | ORAL_TABLET | ORAL | Status: DC
Start: ? — End: 2017-07-07

## 2017-07-13 LAB — CULTURE, FUNGUS WITHOUT SMEAR

## 2017-07-21 ENCOUNTER — Encounter: Payer: Self-pay | Admitting: Nurse Practitioner

## 2017-07-21 ENCOUNTER — Other Ambulatory Visit: Payer: Self-pay

## 2017-07-21 ENCOUNTER — Ambulatory Visit (INDEPENDENT_AMBULATORY_CARE_PROVIDER_SITE_OTHER): Payer: PRIVATE HEALTH INSURANCE | Admitting: Nurse Practitioner

## 2017-07-21 VITALS — BP 132/89 | HR 86 | Temp 98.4°F | Ht 66.0 in | Wt 189.4 lb

## 2017-07-21 DIAGNOSIS — Z01419 Encounter for gynecological examination (general) (routine) without abnormal findings: Secondary | ICD-10-CM | POA: Diagnosis not present

## 2017-07-21 DIAGNOSIS — Z113 Encounter for screening for infections with a predominantly sexual mode of transmission: Secondary | ICD-10-CM

## 2017-07-21 DIAGNOSIS — F17200 Nicotine dependence, unspecified, uncomplicated: Secondary | ICD-10-CM | POA: Insufficient documentation

## 2017-07-21 DIAGNOSIS — Z8639 Personal history of other endocrine, nutritional and metabolic disease: Secondary | ICD-10-CM

## 2017-07-21 DIAGNOSIS — D509 Iron deficiency anemia, unspecified: Secondary | ICD-10-CM

## 2017-07-21 DIAGNOSIS — Z1151 Encounter for screening for human papillomavirus (HPV): Secondary | ICD-10-CM | POA: Diagnosis not present

## 2017-07-21 DIAGNOSIS — Z124 Encounter for screening for malignant neoplasm of cervix: Secondary | ICD-10-CM

## 2017-07-21 NOTE — Patient Instructions (Addendum)

## 2017-07-21 NOTE — Progress Notes (Signed)
GYNECOLOGY ANNUAL PREVENTATIVE CARE ENCOUNTER NOTE  Subjective:   Candace Brown is a 46 y.o. G42P0004 female here for a routine annual gynecologic exam.  Current complaints: no GYN care in several years.  Has never had mammogram.   Denies  discharge, pelvic pain, problems with intercourse.  Reports heavy bleeding and cramping with monthly menses.  Has chronic anemia.  Has recently been hospitalized at [facility] after going to ER visit with decreased vision in her right eye.  ER record reviewed.    Gynecologic History Patient's last menstrual period was 07/18/2017 (exact date). Contraception: condoms Last Pap: 3 years. Results were: normal per client Last mammogram: Never had one.   Obstetric History OB History  Gravida Para Term Preterm AB Living  4 4   0   4  SAB TAB Ectopic Multiple Live Births               # Outcome Date GA Lbr Len/2nd Weight Sex Delivery Anes PTL Lv  4 Para           3 Para           2 Para           1 Para               Past Medical History:  Diagnosis Date  . Anemia     No past surgical history on file.  Current Outpatient Medications on File Prior to Visit  Medication Sig Dispense Refill  . pantoprazole (PROTONIX) 40 MG tablet Take 40 mg by mouth.    . predniSONE (DELTASONE) 50 MG tablet Take 50 mg by mouth.    . ferrous sulfate 325 (65 FE) MG EC tablet Take 325 mg daily with breakfast by mouth.    . FIBER PO Take 1 capsule daily by mouth.    . Multiple Vitamins-Minerals (MULTIVITAMIN ADULT) TABS Take 1 tablet daily by mouth.    . predniSONE (DELTASONE) 20 MG tablet Take 3 tablets (60 mg total) by mouth daily with breakfast. (Patient not taking: Reported on 07/21/2017)     No current facility-administered medications on file prior to visit.     No Known Allergies  Social History   Socioeconomic History  . Marital status: Single    Spouse name: Not on file  . Number of children: Not on file  . Years of education: Not on file  . Highest  education level: Not on file  Social Needs  . Financial resource strain: Not on file  . Food insecurity - worry: Not on file  . Food insecurity - inability: Not on file  . Transportation needs - medical: Not on file  . Transportation needs - non-medical: Not on file  Occupational History  . Not on file  Tobacco Use  . Smoking status: Current Every Day Smoker  . Smokeless tobacco: Never Used  Substance and Sexual Activity  . Alcohol use: No    Frequency: Never  . Drug use: No  . Sexual activity: Not on file  Other Topics Concern  . Not on file  Social History Narrative  . Not on file    Family History  Problem Relation Age of Onset  . Diabetes Mellitus II Neg Hx   . Cancer Neg Hx     The following portions of the patient's history were reviewed and updated as appropriate: allergies, current medications, past family history, past medical history, past social history, past surgical history and problem list.  Review of  Systems A comprehensive review of systems was negative except for: Eyes: positive for as yet unknown cause of decreased vision in right eye Respiratory: positive for mass identified on lung and she has a biopsy scheduled next week for further evaluation Gynecologic concerns:  Heavy vaginal bleeding with monthly menses for 4-5 days.  Aslo has decreased vision in right eye.   Objective:  BP 132/89   Pulse 86   Temp 98.4 F (36.9 C)   Ht _0  (1.676 m)   Wt 189 lb 6.4 oz (85.9 kg)   LMP 07/18/2017 (Exact Date)   BMI 30.57 kg/m  CONSTITUTIONAL: Well-developed, well-nourished female in no acute distress.  HENT:  Normocephalic, atraumatic, External right and left ear normal. Oropharynx is clear and moist   Missing some teeth in the front EYES: Conjunctivae and EOM are normal.  No scleral icterus.  NECK: Normal range of motion, supple, no masses.  Normal thyroid.  SKIN: Skin is warm and dry. No rash noted. Not diaphoretic. No erythema. No pallor. NEUROLOGIC:  Alert and oriented to person, place, and time. Normal reflexes, muscle tone coordination. No cranial nerve deficit noted. PSYCHIATRIC: Normal mood and affect. Normal behavior. Normal judgment and thought content. CARDIOVASCULAR: Normal heart rate noted, regular rhythm RESPIRATORY: Clear to auscultation bilaterally. Effort and breath sounds normal, no problems with respiration noted. BREASTS: Symmetric in size. No masses, skin changes, nipple drainage, or lymphadenopathy. ABDOMEN: Soft, no distention noted.  No tenderness, rebound or guarding.  PELVIC: Normal appearing external genitalia; normal appearing vaginal mucosa and cervix.  No abnormal discharge noted.  Pap smear obtained.  Normal uterine size, no other palpable masses, no uterine or adnexal tenderness. MUSCULOSKELETAL: Normal range of motion. No tenderness.  No cyanosis, clubbing, or edema.     Assessment and Plan:  1. Well woman exam with routine gynecological exam No medical care in 3 years.  Previously was with [facility]. - Cytology - PAP - Cervicovaginal ancillary only - CBC - Comp Met (CMET) - POCT HgB A1C - HIV antibody (with reflex) - RPR - MM Digital Screening; Future  2. Iron deficiency anemia, unspecified iron deficiency anemia type Chronic problem - had one unit of blood at [facility] in late November - still on 07-15-17, HGB was 8.  Heavy menses - consider hormonal IUD to assist in lessening menses. - CBC - Comp Met (CMET) - POCT HgB A1C - HIV antibody (with reflex) - RPR - US PELVIC COMPLETE WITH TRANSVAGINAL; Future  3. History of elevated glucose Seen in Care Everywhere labs done in [place]. - POCT HgB A1C  4.  Smoker - is now down to 2 cigarettes a day.   Encouraged to continue to decrease smoking and encouraged her to stop all smoking.  5.  Conditions which are currently under evaluation and have not had full diagnosis - reason for decreased vision in right eye and mass on lung.  Has follow up  scheduled at the [facility] and is scheduled for a lung biopsy later this week.  Will follow up results of pap smear and manage accordingly. Mammogram ordered and will be scheduled Will also schedule pelvic ultrasound to make sure there is no GYN problem contributing to heavy menses and chronic anemia. Routine preventative health maintenance measures emphasized.  Advised to stop smoking and keep all other appointments with her specialists at [facility] for continued evaluation of her eye and lung mass. Advised to keep taking her iron supplement and add an over the counter  stool softener. Advised to consider hormonal IUD as possibility for treatment for heavy menstrual bleeding and to assist with chronic anemia. Written info on IUD given to client.   Advised to discuss with other specialists to make sure it is OK with her other medical conditions to have hormonal IUD. Please refer to After Visit Summary for other counseling recommendations.  Will be scheduled to see MD at next visit.    Earlie Server, RN, MSN, NP-BC Nurse Practitioner, [place] for [facility]

## 2017-07-22 LAB — COMPREHENSIVE METABOLIC PANEL
ALBUMIN: 3.8 g/dL (ref 3.5–5.5)
ALT: 31 IU/L (ref 0–32)
AST: 36 IU/L (ref 0–40)
Albumin/Globulin Ratio: 1.3 (ref 1.2–2.2)
Alkaline Phosphatase: 58 IU/L (ref 39–117)
BUN / CREAT RATIO: 26 — AB (ref 9–23)
BUN: 21 mg/dL (ref 6–24)
CHLORIDE: 103 mmol/L (ref 96–106)
CO2: 25 mmol/L (ref 20–29)
Calcium: 10 mg/dL (ref 8.7–10.2)
Creatinine, Ser: 0.82 mg/dL (ref 0.57–1.00)
GFR calc non Af Amer: 85 mL/min/{1.73_m2} (ref >59)
GFR, EST AFRICAN AMERICAN: 99 mL/min/{1.73_m2} (ref >59)
GLUCOSE: 121 mg/dL — AB (ref 65–99)
Globulin, Total: 2.9 g/dL (ref 1.5–4.5)
Potassium: 4.3 mmol/L (ref 3.5–5.2)
Sodium: 141 mmol/L (ref 134–144)
TOTAL PROTEIN: 6.7 g/dL (ref 6.0–8.5)

## 2017-07-22 LAB — CBC
HEMATOCRIT: 33.4 % — AB (ref 34.0–46.6)
HEMOGLOBIN: 9.8 g/dL — AB (ref 11.1–15.9)
MCH: 21.7 pg — ABNORMAL LOW (ref 26.6–33.0)
MCHC: 29.3 g/dL — ABNORMAL LOW (ref 31.5–35.7)
MCV: 74 fL — AB (ref 79–97)
Platelets: 420 10*3/uL — ABNORMAL HIGH (ref 150–379)
RBC: 4.52 x10E6/uL (ref 3.77–5.28)
WBC: 12.8 10*3/uL — AB (ref 3.4–10.8)

## 2017-07-22 LAB — RPR: RPR Ser Ql: NONREACTIVE

## 2017-07-22 LAB — HIV ANTIBODY (ROUTINE TESTING W REFLEX): HIV Screen 4th Generation wRfx: NONREACTIVE

## 2017-07-24 LAB — CERVICOVAGINAL ANCILLARY ONLY
CHLAMYDIA, DNA PROBE: NEGATIVE
Neisseria Gonorrhea: NEGATIVE
TRICH (WINDOWPATH): POSITIVE — AB

## 2017-07-24 LAB — CYTOLOGY - PAP
Diagnosis: NEGATIVE
HPV (WINDOPATH): NOT DETECTED

## 2017-07-27 ENCOUNTER — Other Ambulatory Visit: Payer: Self-pay | Admitting: Nurse Practitioner

## 2017-07-27 MED ORDER — METRONIDAZOLE 500 MG PO TABS: 2000.0000 mg | ORAL_TABLET | Freq: Once | ORAL | 0 refills | Status: AC

## 2017-07-27 NOTE — Progress Notes (Signed)
Labs reviewed.  Trichomonas noted.  Medication sent to her pharmacy and clinic to call her as there is no active Mychart. Earlie Server, RN, MSN, NP-BC Nurse Practitioner, [facility] for [employer], [facility] 07/27/2017 12:00 PM

## 2017-08-03 ENCOUNTER — Encounter: Payer: Self-pay | Admitting: Pediatrics

## 2017-08-07 ENCOUNTER — Telehealth: Payer: Self-pay

## 2017-08-07 ENCOUNTER — Ambulatory Visit (HOSPITAL_COMMUNITY)
Admission: RE | Admit: 2017-08-07 | Discharge: 2017-08-07 | Disposition: A | Discharge status: Home / Self Care | Payer: No Typology Code available for payment source | Source: Ambulatory Visit | Attending: Nurse Practitioner | Admitting: Nurse Practitioner

## 2017-08-07 DIAGNOSIS — D509 Iron deficiency anemia, unspecified: Secondary | ICD-10-CM | POA: Insufficient documentation

## 2017-08-07 DIAGNOSIS — N83202 Unspecified ovarian cyst, left side: Secondary | ICD-10-CM | POA: Insufficient documentation

## 2017-08-07 DIAGNOSIS — D259 Leiomyoma of uterus, unspecified: Secondary | ICD-10-CM | POA: Insufficient documentation

## 2017-08-07 DIAGNOSIS — N920 Excessive and frequent menstruation with regular cycle: Secondary | ICD-10-CM | POA: Insufficient documentation

## 2017-08-07 NOTE — Telephone Encounter (Signed)
Returned call and advised of lab results and treatment plan.

## 2017-08-15 ENCOUNTER — Ambulatory Visit: Payer: PRIVATE HEALTH INSURANCE | Admitting: Obstetrics and Gynecology

## 2017-08-17 LAB — MISC LABCORP TEST (SEND OUT): LABCORP TEST CODE: 183656

## 2017-09-03 ENCOUNTER — Encounter (HOSPITAL_COMMUNITY): Payer: Self-pay | Admitting: Emergency Medicine

## 2017-09-03 ENCOUNTER — Emergency Department (HOSPITAL_COMMUNITY)
Admission: EM | Admit: 2017-09-03 | Discharge: 2017-09-03 | Disposition: A | Discharge status: Home / Self Care | Payer: No Typology Code available for payment source | Attending: Emergency Medicine | Admitting: Emergency Medicine

## 2017-09-03 DIAGNOSIS — F17200 Nicotine dependence, unspecified, uncomplicated: Secondary | ICD-10-CM | POA: Insufficient documentation

## 2017-09-03 DIAGNOSIS — Z79899 Other long term (current) drug therapy: Secondary | ICD-10-CM | POA: Diagnosis not present

## 2017-09-03 DIAGNOSIS — H538 Other visual disturbances: Secondary | ICD-10-CM

## 2017-09-03 DIAGNOSIS — R51 Headache: Secondary | ICD-10-CM | POA: Insufficient documentation

## 2017-09-03 NOTE — ED Notes (Signed)
Pt reports to RN that she feels like she did the last time her "blood was low". Pt states "They didn't give me another quart of blood up at [facility], maybe that's low again."

## 2017-09-03 NOTE — ED Triage Notes (Signed)
Patient here from home with complaints of bilateral blurred vision. Headache since yesterday. Diagnosed with eye mass in December.

## 2017-09-03 NOTE — Discharge Instructions (Signed)
Call the [facility] Cardiothoracic Surgery appointment line, at [phone]; to reschedule the surgery for biopsy of your mediastinal mass.  You will also need to follow-up with the ophthalmologist, Dr. Sharyne Richters, by calling [phone], after the surgery has been done.

## 2017-09-03 NOTE — ED Provider Notes (Signed)
[place] DEPT Provider Note   CSN: [id number] Arrival date & time: 09/03/17  1331     History   Chief Complaint Chief Complaint  Patient presents with  . Blurred Vision  . Headache    HPI Candace Brown is a 47 y.o. female.  She presents for blurriness, right eye, for several weeks.  She is interested in getting her surgical procedure done here in [place], for a mass that was discovered on evaluation, 2 months ago at [facility].  She last saw ophthalmology on 07/20/17, at that time surgical procedure was scheduled for 07/27/17, for biopsy of a mediastinal mass.  Patient chose not to proceed with that surgery, because "my bill is over $77,000."  She presents here today to see about getting her procedures done here.  She denies nausea, vomiting, fever, chills, weakness or dizziness.  She is here with a family member.  There are no other known modifying factors.  HPI  Past Medical History:  Diagnosis Date  . Anemia     Patient Active Problem List   Diagnosis Date Noted  . Current smoker 07/21/2017  . 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 Vision loss of right eye 06/22/2017  . Thrombocytosis ([place]) 06/22/2017  . Anemia 06/22/2017    History reviewed. No pertinent surgical history.  OB History    Gravida Para Term Preterm AB Living   4 4   0   4   SAB TAB Ectopic Multiple Live Births                   Home Medications    Prior to Admission medications   Medication Sig Start Date End Date Taking? Authorizing Provider  ferrous sulfate 325 (65 FE) MG EC tablet Take 325 mg daily with breakfast by mouth.    [provider]  FIBER PO Take 1 capsule daily by mouth.    [provider]  Multiple Vitamins-Minerals (MULTIVITAMIN ADULT) TABS Take 1 tablet daily by mouth.    [provider]  pantoprazole (PROTONIX) 40 MG tablet Take 40 mg by mouth. 07/19/17   [provider]  predniSONE (DELTASONE) 20 MG tablet Take 3 tablets (60 mg total) by mouth daily with breakfast. Patient not taking: Reported on 07/21/2017 06/30/17   Hosie Poisson, MD  predniSONE (DELTASONE) 50 MG tablet Take 50 mg by mouth. 07/18/17   [provider]    Family History Family History  Problem Relation Age of Onset  . Diabetes Mellitus II Neg Hx   . Cancer Neg Hx     Social History Social History   Tobacco Use  . Smoking status: Current Every Day Smoker  . Smokeless tobacco: Never Used  Substance Use Topics  . Alcohol use: No    Frequency: Never  . Drug use: No     Allergies   Patient has no known allergies.   Review of Systems Review of Systems  All other systems reviewed and are negative.    Physical Exam Updated Vital Signs BP 128/80 (BP Location: Right Arm)   Pulse (!) 110   Temp 98.4 F (36.9 C) (Oral)   Resp 18   SpO2 98%   Physical Exam  Constitutional: She is oriented to person, place, and time. She appears well-developed and well-nourished. She does not appear ill.  HENT:  Head: Normocephalic and atraumatic.  Right Ear: External  ear normal.  Left Ear: External ear normal.  Eyes: Conjunctivae and EOM are normal. Pupils are equal, round, and reactive to light.  Neck: Normal range of motion and phonation normal. Neck supple.  Cardiovascular: Normal rate, regular rhythm and normal heart sounds.  Pulmonary/Chest: Effort normal and breath sounds normal. She exhibits no bony tenderness.  Neurological: She is alert and oriented to person, place, and time. No cranial nerve deficit or sensory deficit. She exhibits normal muscle tone. Coordination normal.  No dysarthria, or aphasia  Skin: Skin is intact.  Psychiatric: She has a normal mood and affect. Her behavior is normal. Judgment and thought content normal.  Nursing note and vitals  reviewed.    ED Treatments / Results  Labs (all labs ordered are listed, but only abnormal results are displayed) Labs Reviewed - No data to display  EKG  EKG Interpretation None       Radiology No results found.  Procedures Procedures (including critical care time)  Medications Ordered in ED Medications - No data to display   Initial Impression / Assessment and Plan / ED Course  I have reviewed the triage vital signs and the nursing notes.  Pertinent labs & imaging results that were available during my care of the patient were reviewed by me and considered in my medical decision making (see chart for details).      Patient Vitals for the past 24 hrs:  BP Temp Temp src Pulse Resp SpO2  09/03/17 1433 128/80 98.4 F (36.9 C) Oral (!) 110 18 98 %    4:13 PM Reevaluation with update and discussion. After initial assessment and treatment, an updated evaluation reveals no change in clinical status.  Findings and plan discussed with patient and family member, all questions answered. 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  Patient interrupted the workup and treatment for her problem, voluntarily, about 6 weeks ago.  She came here today wanting to proceed with treatment at this facility, which is unable to meet the needs of this complicated oncologic issue.  She does not have any acute changes today that require intervention or transfer.  Patient will be given contact information to reschedule her mediastinal mass biopsy, after which she can proceed with the intervention/surgery by ophthalmology.  Patient is agreeable to this process, after understanding that her needs cannot be met here in [facility].  Nursing Notes Reviewed/ Care Coordinated Applicable Imaging Reviewed Interpretation of Laboratory Data incorporated  into ED treatment  The patient appears reasonably screened and/or stabilized for discharge and I doubt any other medical condition or other [facility] requiring further screening, evaluation, or treatment in the ED at this time prior to discharge.  Plan: Home Medications-continue usual medications; Home Treatments-rest, fluids; return here if the recommended treatment, does not improve the symptoms; Recommended follow up-PCP, as needed  ED Discharge Orders    None       Daleen Bo, MD 09/03/17 [phone]

## 2017-09-11 ENCOUNTER — Ambulatory Visit: Payer: No Typology Code available for payment source | Admitting: Obstetrics and Gynecology

## 2017-10-11 ENCOUNTER — Other Ambulatory Visit: Payer: Self-pay | Admitting: Oncology

## 2017-11-09 ENCOUNTER — Other Ambulatory Visit: Payer: Self-pay | Admitting: Oncology

## 2018-03-27 IMAGING — MR MR ORBITS WO/W CM
6 of 7 series · 35 of 48 positions shown · IV contrast (multihance)
Comparison: MRI brain and orbits without and with contrast
06/21/2017.

CLINICAL DATA: Right orbital apex and middle cranial fossa mass
status post 5 days of high dose steroids.

EXAM:
MRI OF THE ORBITS WITHOUT AND WITH CONTRAST
TECHNIQUE: Multiplanar, multisequence MR imaging of the orbits was performed
both before and after the administration of intravenous contrast.
CONTRAST:  18mL MULTIHANCE GADOBENATE DIMEGLUMINE 529 MG/ML IV SOLN

[Series 3: T1 · sagittal · 5.0mm · 0.94mm/px · 6 of 22 slices shown (1 of 2)]
[im 1/22]
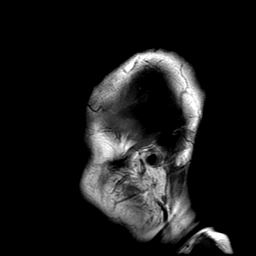
[im 5/22]
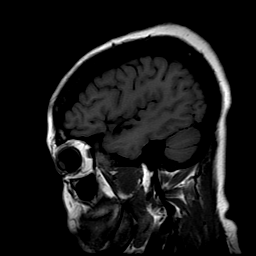
[im 9/22]
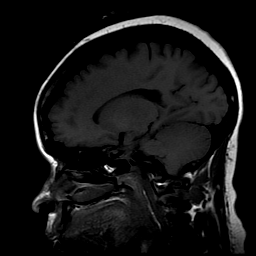
[im 13/22]
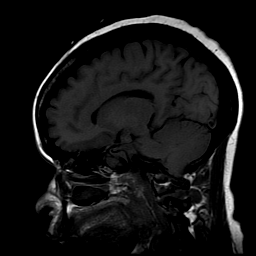
[im 17/22]
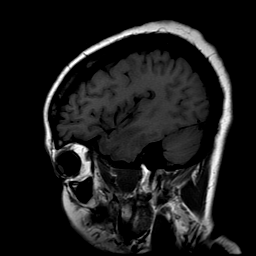
[im 22/22]
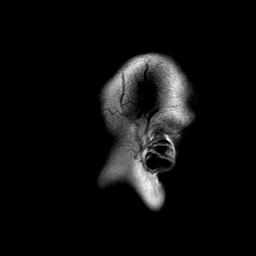

[Series 4: T2 fat-sat · axial · 3.0mm · 0.35mm/px · z∈[-14,+36]mm · 5 of 16 slices shown (1 of 2)]
[im 1/16]
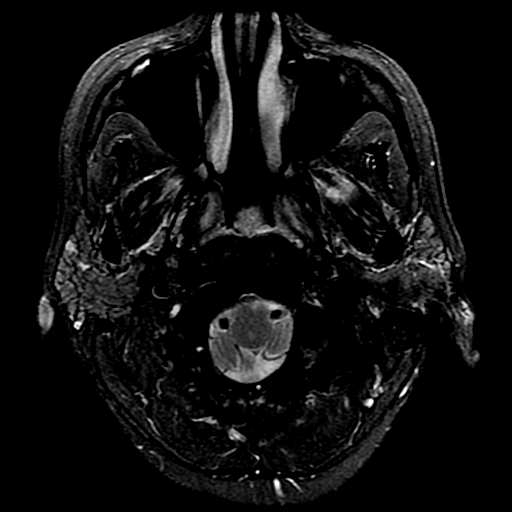
[im 4/16]
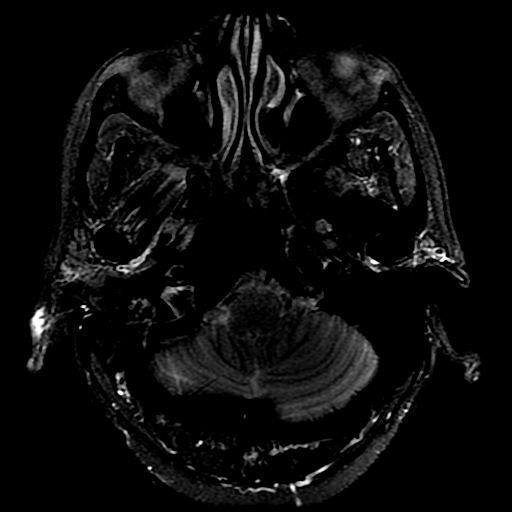
[im 8/16]
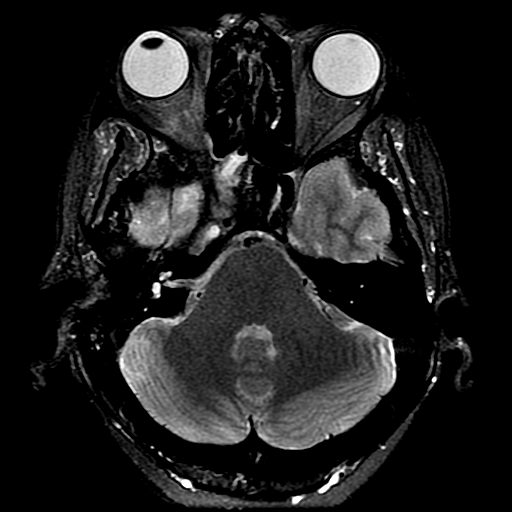
[im 12/16]
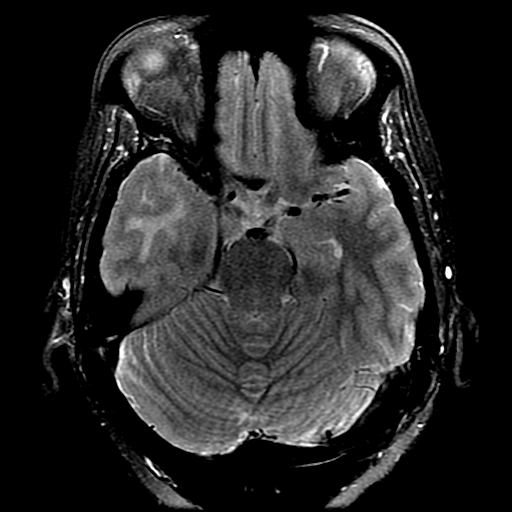
[im 16/16]
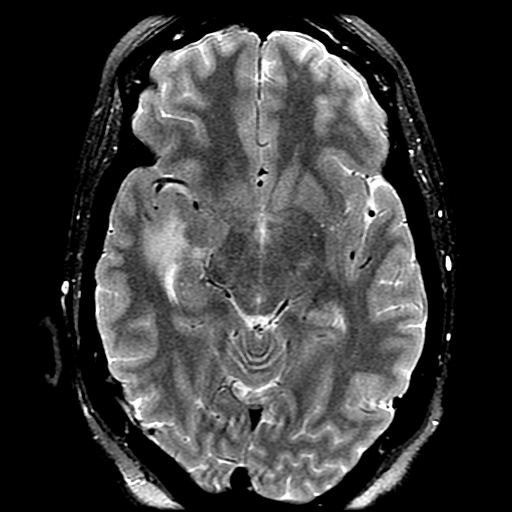

[Series 5: T1 · axial · 3.0mm · 0.35mm/px · 1 of 16 slices shown (2 of 2)]
[im 1/16]
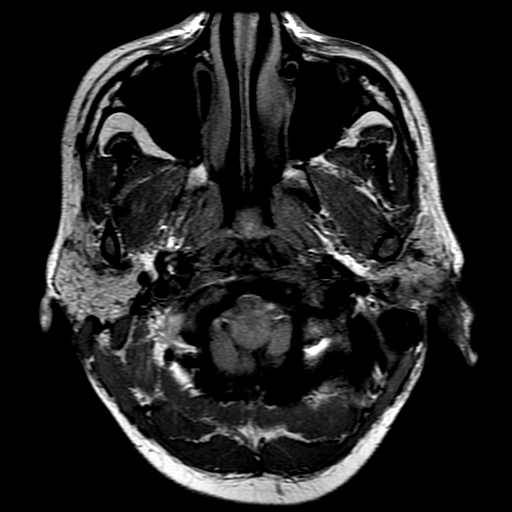

[Series 6: T2 fat-sat · coronal · 3.0mm · 0.70mm/px · 9 of 29 slices shown (2 of 2)]
[im 1/29]
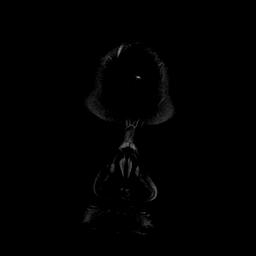
[im 4/29]
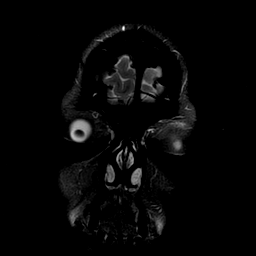
[im 8/29]
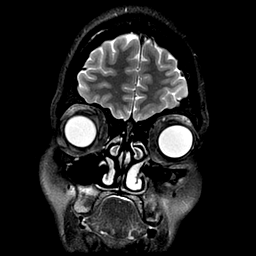
[im 11/29]
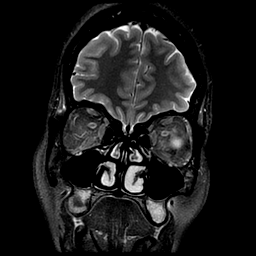
[im 15/29]
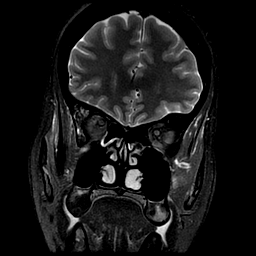
[im 18/29]
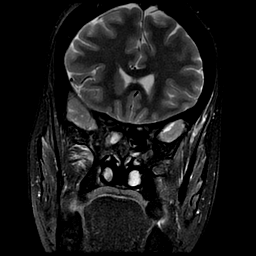
[im 22/29]
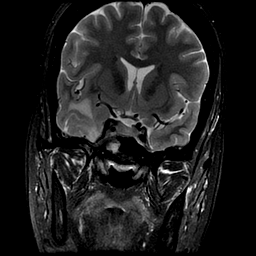
[im 25/29]
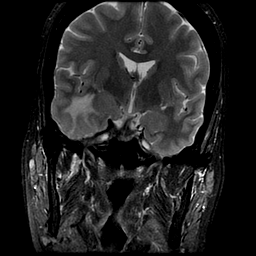
[im 29/29]
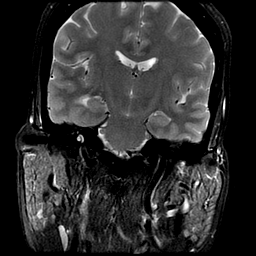

[Series 8: T1 post-contrast · axial · 3.0mm · 0.35mm/px · z∈[-14,+36]mm · 5 of 16 slices shown (1 of 2)]
[im 1/16]
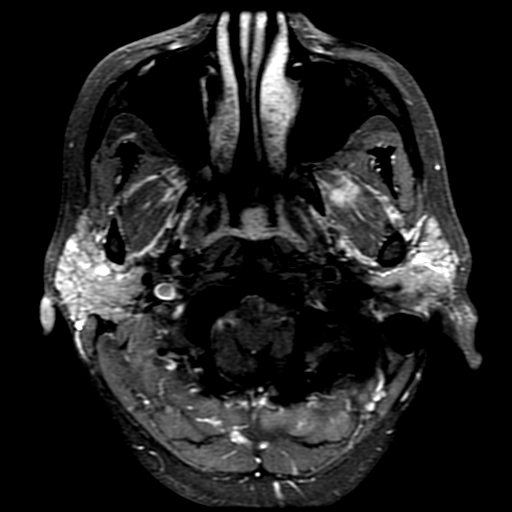
[im 4/16]
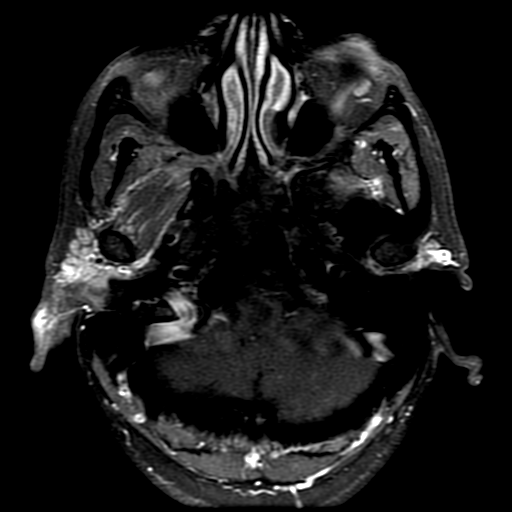
[im 8/16]
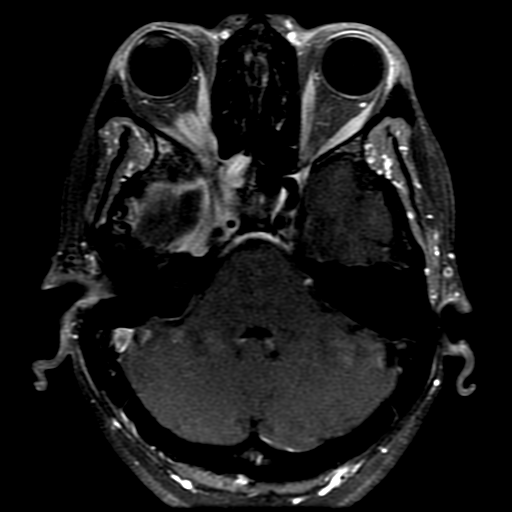
[im 12/16]
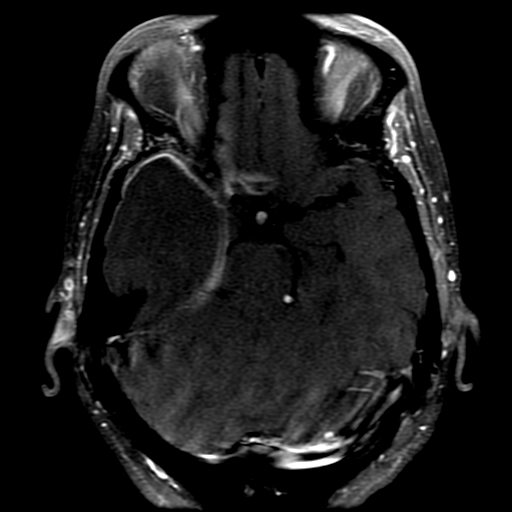
[im 16/16]
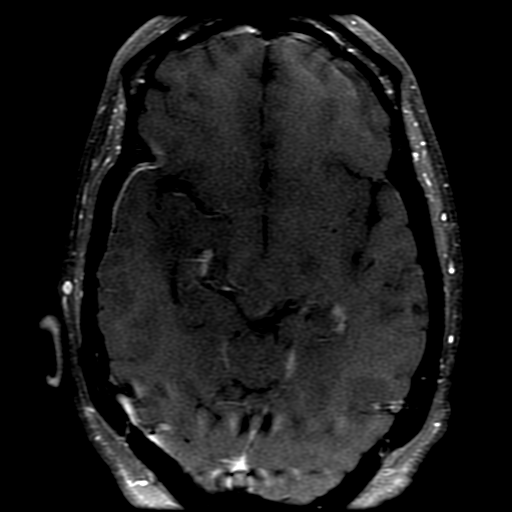

[Series 9: T1 post-contrast · coronal · 3.0mm · 0.70mm/px · 9 of 29 slices shown (2 of 2)]
[im 1/29]
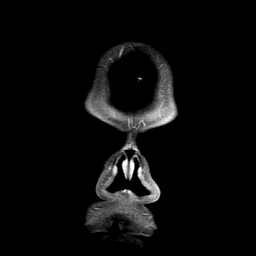
[im 4/29]
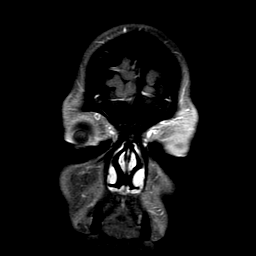
[im 8/29]
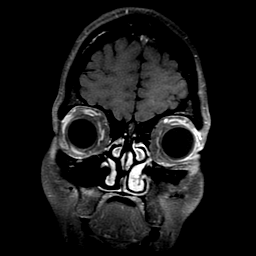
[im 11/29]
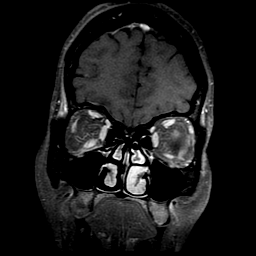
[im 15/29]
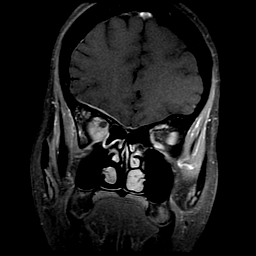
[im 18/29]
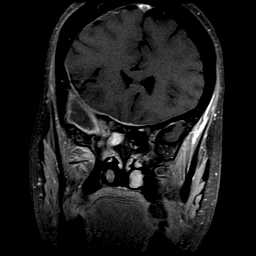
[im 22/29]
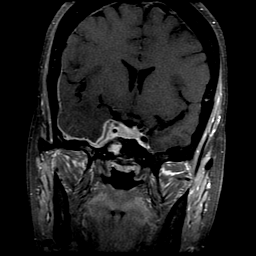
[im 25/29]
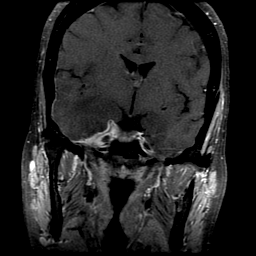
[im 29/29]
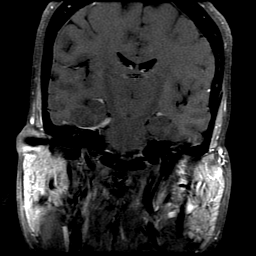

[35 of 48 positions shown; findings below may reference images not displayed]

FINDINGS: Extensive enhancement is again seen along the posterior right
orbital apex with enhancing mass along the inferior medial aspect of
the apex and surrounding the right optic nerve. This has not changed
significantly since the prior exam. It measures 10.5 mm in short
access just anterior to the apex. There is enhancement extending
along the right cavernous sinus and inferiorly along the V3 nerve.
Diffuse enhancement along the dura is stable. There is asymmetric
enhancement of Meckel's cave on the right in the cavernous sinus.

Extensive T2 signal change is present within the subcortical right
temporal lobe with some distortion of the cortex inferiorly and
medially.

The left orbit and globe is within normal limits. Sphenoid sinus
opacification is again noted. There is mild mucosal thickening in
the posterior ethmoid air cells. Some fluid is present in the right
mastoid air cells.

Minimal cerebellar tonsillar ectopia is stable without a Chiari
malformation.
IMPRESSION: 1. Stable enhancing soft tissue in the posterior right orbital apex
inferiorly and medially with extension along the right cavernous
sinus and the P3 division of the trigeminal nerve. This is most
concerning for an infiltrative neoplastic process. This does not
have the appearance of meningioma. The leading differential
diagnosis would be lymphoma of versus infiltrative metastatic
disease. There is no significant response to the steroids.
2. Marked edema in some encephalomalacia in the right temporal tip.
There is some involvement of the inferior medial right temporal lobe
which may be neoplastic is well. Question seizures.
